# Patient Record
Sex: Female | Born: 1990 | Race: Black or African American | Hispanic: No | Marital: Single | Smoking: Never smoker
Health system: Southern US, Community
[De-identification: ages and names within clinical notes are randomized; demographics above are authoritative.]

## PROBLEM LIST (undated history)

## (undated) DIAGNOSIS — Z789 Other specified health status: Secondary | ICD-10-CM

## (undated) HISTORY — DX: Other specified health status: Z78.9

---
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
## 2015-04-01 ENCOUNTER — Encounter (HOSPITAL_COMMUNITY): Payer: Self-pay | Admitting: *Deleted

## 2015-04-01 ENCOUNTER — Inpatient Hospital Stay (HOSPITAL_COMMUNITY)
Admission: AD | Admit: 2015-04-01 | Discharge: 2015-04-01 | Disposition: A | Discharge status: Home / Self Care | Payer: Medicaid Other | Source: Ambulatory Visit | Attending: Obstetrics & Gynecology | Admitting: Obstetrics & Gynecology

## 2015-04-01 DIAGNOSIS — A499 Bacterial infection, unspecified: Secondary | ICD-10-CM | POA: Diagnosis not present

## 2015-04-01 DIAGNOSIS — F17210 Nicotine dependence, cigarettes, uncomplicated: Secondary | ICD-10-CM | POA: Diagnosis not present

## 2015-04-01 DIAGNOSIS — B9689 Other specified bacterial agents as the cause of diseases classified elsewhere: Secondary | ICD-10-CM

## 2015-04-01 DIAGNOSIS — N760 Acute vaginitis: Secondary | ICD-10-CM

## 2015-04-01 DIAGNOSIS — N898 Other specified noninflammatory disorders of vagina: Secondary | ICD-10-CM | POA: Diagnosis present

## 2015-04-01 LAB — WET PREP, GENITAL
TRICH WET PREP: NONE SEEN
YEAST WET PREP: NONE SEEN

## 2015-04-01 LAB — URINE MICROSCOPIC-ADD ON

## 2015-04-01 LAB — URINALYSIS, ROUTINE W REFLEX MICROSCOPIC
BILIRUBIN URINE: NEGATIVE
GLUCOSE, UA: NEGATIVE mg/dL
KETONES UR: NEGATIVE mg/dL
Leukocytes, UA: NEGATIVE
NITRITE: NEGATIVE
PH: 6 (ref 5.0–8.0)
PROTEIN: NEGATIVE mg/dL
Specific Gravity, Urine: 1.025 (ref 1.005–1.030)
Urobilinogen, UA: 0.2 mg/dL (ref 0.0–1.0)

## 2015-04-01 LAB — POCT PREGNANCY, URINE: PREG TEST UR: NEGATIVE

## 2015-04-01 MED ORDER — METRONIDAZOLE 500 MG PO TABS: 500.0000 mg | ORAL_TABLET | Freq: Two times a day (BID) | ORAL | Status: DC

## 2015-04-01 NOTE — MAU Provider Note (Signed)
History     CSN: [id number]  Arrival date and time: 04/01/15 [id number]   First Provider Initiated Contact with Patient 04/01/15 1937      Chief Complaint  Patient presents with  . Vaginal Discharge   HPI Ms. Helen Powell is a 24 y.o. G2P2000 who presents to MAU today with complaint of vaginal discharge x 2 months. The patient states off-white color and mild odor noted. She denies vaginal bleeding, abdominal pain, fever, N/V/D or UTI symptoms. She has not tried any over-the-counter medications. She is not currently sexually active and does not use any birth control.   OB History    Gravida Para Term Preterm AB TAB SAB Ectopic Multiple Living   2 2 2              History reviewed. No pertinent past medical history.  Past Surgical History  Procedure Laterality Date  . Cesarean section      Family History  Problem Relation Age of Onset  . Hypertension Mother   . Diabetes Mother     History  Substance Use Topics  . Smoking status: Current Some Day Smoker  . Smokeless tobacco: Not on file  . Alcohol Use: No    Allergies: No Known Allergies  No prescriptions prior to admission    Review of Systems  Constitutional: Negative for fever and malaise/fatigue.  Gastrointestinal: Negative for nausea, vomiting, abdominal pain, diarrhea and constipation.  Genitourinary: Negative for dysuria, urgency and frequency.       + vaginal discharge Neg - vaginal bleeding   Physical Exam   Blood pressure 128/70, pulse 63, temperature 98.3 F (36.8 C), temperature source Oral, resp. rate 16, height 5' 4.5" (1.638 m), weight 103 lb 10.4 oz (47.015 kg), last menstrual period 03/22/2015, SpO2 100 %.  Physical Exam  Nursing note and vitals reviewed. Constitutional: She is oriented to person, place, and time. She appears well-developed and well-nourished. No distress.  HENT:  Head: Normocephalic and atraumatic.  Cardiovascular: Normal rate.   Respiratory: Effort normal.  GI: Soft. She  exhibits no distension and no mass. There is no tenderness. There is no rebound and no guarding.  Genitourinary: Uterus is not enlarged and not tender. Cervix exhibits no motion tenderness, no discharge and no friability. Right adnexum displays no mass and no tenderness. Left adnexum displays no mass and no tenderness. No bleeding in the vagina. Vaginal discharge (scant mucus discharge noted with mild odor) found.  Neurological: She is alert and oriented to person, place, and time.  Skin: Skin is warm and dry. No erythema.  Psychiatric: She has a normal mood and affect.   Results for orders placed or performed during the hospital encounter of 04/01/15 (from the past 24 hour(s))  Urinalysis, Routine w reflex microscopic     Status: Abnormal   Collection Time: 04/01/15  7:16 PM  Result Value Ref Range   Color, Urine YELLOW YELLOW   APPearance CLEAR CLEAR   Specific Gravity, Urine 1.025 1.005 - 1.030   pH 6.0 5.0 - 8.0   Glucose, UA NEGATIVE NEGATIVE mg/dL   Hgb urine dipstick TRACE (A) NEGATIVE   Bilirubin Urine NEGATIVE NEGATIVE   Ketones, ur NEGATIVE NEGATIVE mg/dL   Protein, ur NEGATIVE NEGATIVE mg/dL   Urobilinogen, UA 0.2 0.0 - 1.0 mg/dL   Nitrite NEGATIVE NEGATIVE   Leukocytes, UA NEGATIVE NEGATIVE  Urine microscopic-add on     Status: None   Collection Time: 04/01/15  7:16 PM  Result Value Ref Range  Squamous Epithelial / LPF RARE RARE   WBC, UA 0-2 <3 WBC/hpf   RBC / HPF 0-2 <3 RBC/hpf   Bacteria, UA RARE RARE  Pregnancy, urine POC     Status: None   Collection Time: 04/01/15  7:33 PM  Result Value Ref Range   Preg Test, Ur NEGATIVE NEGATIVE  Wet prep, genital     Status: Abnormal   Collection Time: 04/01/15  7:43 PM  Result Value Ref Range   Yeast Wet Prep HPF POC NONE SEEN NONE SEEN   Trich, Wet Prep NONE SEEN NONE SEEN   Clue Cells Wet Prep HPF POC FEW (A) NONE SEEN   WBC, Wet Prep HPF POC FEW (A) NONE SEEN    MAU Course  Procedures None  MDM UPT -  negative UA, wet prep, GC/Chlamydia, RPR and HIV today  Assessment and Plan  A: Bacterial vaginosis  P: Discharge home Rx for Flagyl given to patient Patient advised to follow-up with PCP as needed  Patient may return to MAU as needed or if her condition were to change or worsen   Marny LowensteinJulie N Brooklyn Alfredo, PA-C  04/01/2015, 8:44 PM

## 2015-04-01 NOTE — MAU Note (Signed)
Cloudy malodorous vaginal discharge daily for several months. Denies vaginal irritation. Denies urinary complaints. LMP 4/28.  Denies abdominal pain.

## 2015-04-01 NOTE — Discharge Instructions (Signed)
Bacterial Vaginosis Bacterial vaginosis is a vaginal infection that occurs when the normal balance of bacteria in the vagina is disrupted. It results from an overgrowth of certain bacteria. This is the most common vaginal infection in women of childbearing age. Treatment is important to prevent complications, especially in pregnant women, as it can cause a premature delivery. CAUSES  Bacterial vaginosis is caused by an increase in harmful bacteria that are normally present in smaller amounts in the vagina. Several different kinds of bacteria can cause bacterial vaginosis. However, the reason that the condition develops is not fully understood. RISK FACTORS Certain activities or behaviors can put you at an increased risk of developing bacterial vaginosis, including:  Having a new sex partner or multiple sex partners.  Douching.  Using an intrauterine device (IUD) for contraception. Women do not get bacterial vaginosis from toilet seats, bedding, swimming pools, or contact with objects around them. SIGNS AND SYMPTOMS  Some women with bacterial vaginosis have no signs or symptoms. Common symptoms include:  Grey vaginal discharge.  A fishlike odor with discharge, especially after sexual intercourse.  Itching or burning of the vagina and vulva.  Burning or pain with urination. DIAGNOSIS  Your health care provider will take a medical history and examine the vagina for signs of bacterial vaginosis. A sample of vaginal fluid may be taken. Your health care provider will look at this sample under a microscope to check for bacteria and abnormal cells. A vaginal pH test may also be done.  TREATMENT  Bacterial vaginosis may be treated with antibiotic medicines. These may be given in the form of a pill or a vaginal cream. A second round of antibiotics may be prescribed if the condition comes back after treatment.  HOME CARE INSTRUCTIONS   Only take over-the-counter or prescription medicines as  directed by your health care provider.  If antibiotic medicine was prescribed, take it as directed. Make sure you finish it even if you start to feel better.  Do not have sex until treatment is completed.  Tell all sexual partners that you have a vaginal infection. They should see their health care provider and be treated if they have problems, such as a mild rash or itching.  Practice safe sex by using condoms and only having one sex partner. SEEK MEDICAL CARE IF:   Your symptoms are not improving after 3 days of treatment.  You have increased discharge or pain.  You have a fever. MAKE SURE YOU:   Understand these instructions.  Will watch your condition.  Will get help right away if you are not doing well or get worse. FOR MORE INFORMATION  Centers for Disease Control and Prevention, Division of STD Prevention: www.cdc.gov/std American Sexual Health Association (ASHA): www.ashastd.org  Document Released: 11/10/2005 Document Revised: 08/31/2013 Document Reviewed: 06/22/2013 ExitCare Patient Information 2015 [employer]. This information is not intended to replace advice given to you by your health care provider. Make sure you discuss any questions you have with your health care provider.  

## 2015-04-02 LAB — GC/CHLAMYDIA PROBE AMP (~~LOC~~) NOT AT ARMC
Chlamydia: NEGATIVE
NEISSERIA GONORRHEA: NEGATIVE

## 2015-04-02 LAB — HIV ANTIBODY (ROUTINE TESTING W REFLEX): HIV SCREEN 4TH GENERATION: NONREACTIVE

## 2015-04-02 LAB — RPR: RPR Ser Ql: NONREACTIVE

## 2017-12-01 DIAGNOSIS — Z98891 History of uterine scar from previous surgery: Secondary | ICD-10-CM

## 2020-10-25 ENCOUNTER — Emergency Department (HOSPITAL_COMMUNITY)
Admission: EM | Admit: 2020-10-25 | Discharge: 2020-10-26 | Disposition: A | Discharge status: Home / Self Care | Payer: Self-pay | Attending: Emergency Medicine | Admitting: Emergency Medicine

## 2020-10-25 ENCOUNTER — Encounter (HOSPITAL_COMMUNITY): Payer: Self-pay | Admitting: Emergency Medicine

## 2020-10-25 ENCOUNTER — Other Ambulatory Visit: Payer: Self-pay

## 2020-10-25 DIAGNOSIS — E349 Endocrine disorder, unspecified: Secondary | ICD-10-CM

## 2020-10-25 DIAGNOSIS — R891 Abnormal level of hormones in specimens from other organs, systems and tissues: Secondary | ICD-10-CM | POA: Insufficient documentation

## 2020-10-25 DIAGNOSIS — F17200 Nicotine dependence, unspecified, uncomplicated: Secondary | ICD-10-CM | POA: Insufficient documentation

## 2020-10-25 DIAGNOSIS — B9689 Other specified bacterial agents as the cause of diseases classified elsewhere: Secondary | ICD-10-CM | POA: Insufficient documentation

## 2020-10-25 DIAGNOSIS — N760 Acute vaginitis: Secondary | ICD-10-CM | POA: Insufficient documentation

## 2020-10-25 DIAGNOSIS — N3000 Acute cystitis without hematuria: Secondary | ICD-10-CM | POA: Insufficient documentation

## 2020-10-25 LAB — URINALYSIS, ROUTINE W REFLEX MICROSCOPIC
Bilirubin Urine: NEGATIVE
Glucose, UA: 50 mg/dL — AB
Ketones, ur: NEGATIVE mg/dL
Nitrite: NEGATIVE
Protein, ur: 100 mg/dL — AB
Specific Gravity, Urine: 1.023 (ref 1.005–1.030)
WBC, UA: >50 WBC/hpf — ABNORMAL HIGH (ref 0–5)
pH: 7 (ref 5.0–8.0)

## 2020-10-25 LAB — CBC WITH DIFFERENTIAL/PLATELET
Abs Immature Granulocytes: 0.01 10*3/uL (ref 0.00–0.07)
Basophils Absolute: 0 10*3/uL (ref 0.0–0.1)
Basophils Relative: 1 %
Eosinophils Absolute: 0.1 10*3/uL (ref 0.0–0.5)
Eosinophils Relative: 2 %
HCT: 32.1 % — ABNORMAL LOW (ref 36.0–46.0)
Hemoglobin: 9.7 g/dL — ABNORMAL LOW (ref 12.0–15.0)
Immature Granulocytes: 0 %
Lymphocytes Relative: 42 %
Lymphs Abs: 2.4 10*3/uL (ref 0.7–4.0)
MCH: 24.6 pg — ABNORMAL LOW (ref 26.0–34.0)
MCHC: 30.2 g/dL (ref 30.0–36.0)
MCV: 81.5 fL (ref 80.0–100.0)
Monocytes Absolute: 0.7 10*3/uL (ref 0.1–1.0)
Monocytes Relative: 12 %
Neutro Abs: 2.5 10*3/uL (ref 1.7–7.7)
Neutrophils Relative %: 43 %
Platelets: 260 10*3/uL (ref 150–400)
RBC: 3.94 MIL/uL (ref 3.87–5.11)
RDW: 16.3 % — ABNORMAL HIGH (ref 11.5–15.5)
WBC: 5.7 10*3/uL (ref 4.0–10.5)
nRBC: 0 % (ref 0.0–0.2)

## 2020-10-25 LAB — BASIC METABOLIC PANEL
Anion gap: 10 (ref 5–15)
BUN: 11 mg/dL (ref 6–20)
CO2: 25 mmol/L (ref 22–32)
Calcium: 9.6 mg/dL (ref 8.9–10.3)
Chloride: 103 mmol/L (ref 98–111)
Creatinine, Ser: 0.73 mg/dL (ref 0.44–1.00)
GFR, Estimated: >60 mL/min (ref >60)
Glucose, Bld: 93 mg/dL (ref 70–99)
Potassium: 3.4 mmol/L — ABNORMAL LOW (ref 3.5–5.1)
Sodium: 138 mmol/L (ref 135–145)

## 2020-10-25 LAB — WET PREP, GENITAL
Sperm: NONE SEEN
Trich, Wet Prep: NONE SEEN
Yeast Wet Prep HPF POC: NONE SEEN

## 2020-10-25 LAB — I-STAT BETA HCG BLOOD, ED (MC, WL, AP ONLY): I-stat hCG, quantitative: 28.1 m[IU]/mL — ABNORMAL HIGH (ref <5)

## 2020-10-25 NOTE — ED Provider Notes (Signed)
[facility] EMERGENCY DEPARTMENT Provider Note   CSN: [id number] Arrival date & time: 10/25/20  1908     History Chief Complaint  Patient presents with   Dysuria    Helen Powell is a 29 y.o. female.  The history is provided by the patient and medical records.  Dysuria  Helen Powell is a 29 y.o. female who presents to the Emergency Department complaining of dysuria. She is a G5 P30 23 that is here one month status post 13 week D&C for two days of dysuria. She has experienced persistent spotting since the [facility] and has some mild lower abdominal discomfort.  She describes the spotting as looking like old blood. She comes in due to concern for STD. She is sexually active and does not use protection. She denies any vaginal discharge. She denies any fevers, nausea, vomiting. Symptoms are mild to moderate in nature.    History reviewed. No pertinent past medical history.  There are no problems to display for this patient.   Past Surgical History:  Procedure Laterality Date   CESAREAN SECTION       OB History    Gravida  2   Para  2   Term  2   Preterm      AB      Living        SAB      TAB      Ectopic      Multiple      Live Births              Family History  Problem Relation Age of Onset   Hypertension Mother    Diabetes Mother     Social History   Tobacco Use   Smoking status: Current Some Day Smoker   Smokeless tobacco: Never Used  Substance Use Topics   Alcohol use: No   Drug use: No    Home Medications Prior to Admission medications   Medication Sig Start Date End Date Taking? Authorizing Provider  cephALEXin (KEFLEX) 500 MG capsule Take 1 capsule (500 mg total) by mouth 3 (three) times daily. 10/26/20   Tilden Fossa, MD  metroNIDAZOLE (FLAGYL) 500 MG tablet Take 1 tablet (500 mg total) by mouth 2 (two) times daily. 10/26/20   Tilden Fossa, MD    Allergies    Patient has no known  allergies.  Review of Systems   Review of Systems  Genitourinary: Positive for dysuria.  All other systems reviewed and are negative.   Physical Exam Updated Vital Signs BP (!) 114/57 (BP Location: Left Arm)    Pulse 79    Temp 98.6 F (37 C) (Oral)    Resp 16    Ht 5\' 5"  (1.651 m)    Wt 48 kg    SpO2 99%    BMI 17.61 kg/m   Physical Exam Vitals and nursing note reviewed.  Constitutional:      Appearance: She is well-developed.  HENT:     Head: Normocephalic and atraumatic.  Cardiovascular:     Rate and Rhythm: Normal rate and regular rhythm.  Pulmonary:     Effort: Pulmonary effort is normal. No respiratory distress.  Abdominal:     Palpations: Abdomen is soft.     Tenderness: There is no abdominal tenderness. There is no guarding or rebound.  Genitourinary:    Comments: No CMT. Scant brown vaginal discharge Musculoskeletal:        General: No tenderness.  Skin:  General: Skin is warm and dry.  Neurological:     Mental Status: She is alert and oriented to person, place, and time.  Psychiatric:        Behavior: Behavior normal.     ED Results / Procedures / Treatments   Labs (all labs ordered are listed, but only abnormal results are displayed) Labs Reviewed  WET PREP, GENITAL - Abnormal; Notable for the following components:      Result Value   Clue Cells Wet Prep HPF POC PRESENT (*)    WBC, Wet Prep HPF POC FEW (*)    All other components within normal limits  URINALYSIS, ROUTINE W REFLEX MICROSCOPIC - Abnormal; Notable for the following components:   APPearance HAZY (*)    Glucose, UA 50 (*)    Hgb urine dipstick MODERATE (*)    Protein, ur 100 (*)    Leukocytes,Ua MODERATE (*)    WBC, UA >50 (*)    Bacteria, UA MANY (*)    All other components within normal limits  CBC WITH DIFFERENTIAL/PLATELET - Abnormal; Notable for the following components:   Hemoglobin 9.7 (*)    HCT 32.1 (*)    MCH 24.6 (*)    RDW 16.3 (*)    All other components within  normal limits  BASIC METABOLIC PANEL - Abnormal; Notable for the following components:   Potassium 3.4 (*)    All other components within normal limits  HCG, QUANTITATIVE, PREGNANCY - Abnormal; Notable for the following components:   hCG, Beta Chain, Quant, S 33 (*)    All other components within normal limits  I-STAT BETA HCG BLOOD, ED ([facility], [facility], AP ONLY) - Abnormal; Notable for the following components:   I-stat hCG, quantitative 28.1 (*)    All other components within normal limits  RPR  HIV ANTIBODY (ROUTINE TESTING W REFLEX)  GC/CHLAMYDIA PROBE AMP ([place]) NOT AT [facility]    EKG None  Radiology No results found.  Procedures Procedures (including critical care time)  Medications Ordered in ED Medications  cephALEXin (KEFLEX) capsule 500 mg (has no administration in time range)  metroNIDAZOLE (FLAGYL) tablet 500 mg (has no administration in time range)    ED Course  I have reviewed the triage vital signs and the nursing notes.  Pertinent labs & imaging results that were available during my care of the patient were reviewed by me and considered in my medical decision making (see chart for details).    MDM Rules/Calculators/A&P                         patient here for evaluation of two days of dysuria and vaginal spotting since D&C one month ago. HCG is mildly elevated at 33. UA is consistent with UTI. Pelvic examination is benign. Presentation is not consistent with PID endometriosis. She did not have her hCG following 20 following her D&C. Discussed with Dr. Shawnie Pons with OB/GYN, will plan for repeat quant in one week. Discussed with patient importance of return precautions as well as outpatient follow-up for retesting.  Final Clinical Impression(s) / ED Diagnoses Final diagnoses:  Acute cystitis without hematuria  Elevated serum hCG  BV (bacterial vaginosis)    Rx / DC Orders ED Discharge Orders         Ordered    cephALEXin (KEFLEX) 500 MG capsule  3 times daily         10/26/20 0016    metroNIDAZOLE (FLAGYL) 500 MG tablet  2 times daily        10/26/20 0016           Tilden Fossa, MD 10/26/20 [phone]

## 2020-10-25 NOTE — ED Triage Notes (Signed)
Patient reports difficulty in urinating onset this week , no hematuria or fever .

## 2020-10-26 LAB — GC/CHLAMYDIA PROBE AMP (~~LOC~~) NOT AT ARMC
Chlamydia: NEGATIVE
Comment: NEGATIVE
Comment: NORMAL
Neisseria Gonorrhea: NEGATIVE

## 2020-10-26 LAB — RPR: RPR Ser Ql: NONREACTIVE

## 2020-10-26 LAB — HIV ANTIBODY (ROUTINE TESTING W REFLEX): HIV Screen 4th Generation wRfx: NONREACTIVE

## 2020-10-26 LAB — HCG, QUANTITATIVE, PREGNANCY: hCG, Beta Chain, Quant, S: 33 m[IU]/mL — ABNORMAL HIGH (ref <5)

## 2020-10-26 MED ORDER — METRONIDAZOLE 500 MG PO TABS: 500.0000 mg | ORAL_TABLET | Freq: Two times a day (BID) | ORAL | 0 refills | Status: DC

## 2020-10-26 MED ORDER — CEPHALEXIN 250 MG PO CAPS
500.0000 mg | ORAL_CAPSULE | Freq: Once | ORAL | Status: AC
Start: 2020-10-26 — End: 2020-10-26
  Administered 2020-10-26: 500 mg via ORAL
  Filled 2020-10-26: qty 2

## 2020-10-26 MED ORDER — CEPHALEXIN 500 MG PO CAPS: 500.0000 mg | ORAL_CAPSULE | Freq: Three times a day (TID) | ORAL | 0 refills | Status: DC

## 2020-10-26 MED ORDER — METRONIDAZOLE 500 MG PO TABS
500.0000 mg | ORAL_TABLET | Freq: Once | ORAL | Status: AC
  Administered 2020-10-26: 500 mg via ORAL
  Filled 2020-10-26: qty 1

## 2020-10-26 NOTE — Discharge Instructions (Signed)
Your pregnancy test was positive today. It is unclear if you're newly pregnant or this is related to your recent procedure. If you develop abdominal pain, heavy bleeding or new concerning symptoms you need to be rechecked immediately. Please follow-up with the women's health care center in one week so you can have your pregnancy hormone rechecked.

## 2020-11-01 ENCOUNTER — Other Ambulatory Visit: Payer: Self-pay | Admitting: *Deleted

## 2020-11-01 DIAGNOSIS — Z3490 Encounter for supervision of normal pregnancy, unspecified, unspecified trimester: Secondary | ICD-10-CM

## 2020-11-02 ENCOUNTER — Other Ambulatory Visit: Payer: Self-pay

## 2020-11-02 DIAGNOSIS — Z3490 Encounter for supervision of normal pregnancy, unspecified, unspecified trimester: Secondary | ICD-10-CM

## 2020-11-03 LAB — BETA HCG QUANT (REF LAB): hCG Quant: 12 m[IU]/mL

## 2021-11-19 ENCOUNTER — Other Ambulatory Visit: Payer: Self-pay

## 2021-11-19 ENCOUNTER — Ambulatory Visit (INDEPENDENT_AMBULATORY_CARE_PROVIDER_SITE_OTHER): Payer: Medicaid Other | Admitting: *Deleted

## 2021-11-19 VITALS — BP 109/62 | HR 90 | Temp 98.1°F | Ht 65.0 in | Wt 110.4 lb

## 2021-11-19 DIAGNOSIS — O0930 Supervision of pregnancy with insufficient antenatal care, unspecified trimester: Secondary | ICD-10-CM

## 2021-11-19 DIAGNOSIS — O99320 Drug use complicating pregnancy, unspecified trimester: Secondary | ICD-10-CM | POA: Insufficient documentation

## 2021-11-19 DIAGNOSIS — Z3480 Encounter for supervision of other normal pregnancy, unspecified trimester: Secondary | ICD-10-CM | POA: Insufficient documentation

## 2021-11-19 DIAGNOSIS — F1290 Cannabis use, unspecified, uncomplicated: Secondary | ICD-10-CM

## 2021-11-19 MED ORDER — PRENATAL PLUS VITAMIN/MINERAL 27-1 MG PO TABS: 1.0000 | ORAL_TABLET | Freq: Every day | ORAL | 12 refills | Status: AC

## 2021-11-19 MED ORDER — TERCONAZOLE 0.4 % VA CREA: 1.0000 | TOPICAL_CREAM | Freq: Every day | VAGINAL | 0 refills | Status: DC

## 2021-11-19 NOTE — Progress Notes (Signed)
° ° °  I discussed the limitations, risks, security and privacy concerns of performing an evaluation and management service by telephone and the availability of in person appointments. I also discussed with the patient that there may be a patient responsible charge related to this service. The patient expressed understanding and agreed to proceed.   Location: [facility] Patient: clinic with FOB Tyree Provider: clinic Interpreter used: no professional interpreter  PRENATAL INTAKE SUMMARY  Helen Powell presents today New OB Nurse Interview.  OB History     Gravida  6   Para  3   Term  3   Preterm      AB  2   Living  3      SAB  1   IAB      Ectopic      Multiple      Live Births  3          I have reviewed the patient's medical, obstetrical, social, and family histories, medications, and available lab results.  SUBJECTIVE She has complaints vaginal itching. Recently diagnosed with BV, completed Metronidazole 500 mg 1 PO BID.  Marijuana usage during pregnancy. Last used 11/18/2021.  OBJECTIVE Initial Nurse interview for history/labs (New OB). LATE TO CARE  last menstrual period date: 05/07/2021 (patient is uncertain of LMP) EDD: 02/16/2022 by LMP  GA: [redacted]w[redacted]d [id number] FHT: 140  GENERAL APPEARANCE: alert, well appearing, in no apparent distress, oriented to person, place and time   ASSESSMENT Normal pregnancy  PLAN Prenatal care:  [facility] Pnl/HIV/Hep C OB Urine Culture GC/CT PAP:  unsure of last PAP HgbEval/SMA/CF ([place]) [facility] A1C Rx PNV sent to pharmacy Rx Terazole vaginal cream for vaginal itching Ultrasound MFM complete >14 weeks to confirm dating and anatomy scan ROI for previous prenatal records due to cesarean deliveries x 3  Blood Pressure Monitor/Weight Scale BP monitor/Weight Scale in person visits  MyChart/Babyscripts MyChart access verified. I explained pt will have some visits in office and some virtually.  Babyscripts instructions given and order placed.   Placed OB Box on problem list and updated   Followup with Raelyn Mora, CNM  APP 11/20/2021 at  10:35 AM  11/26/2021 8:30 AM 2 hr gtt Lab visit  Follow Up Instructions:   I discussed the assessment and treatment plan with the patient. The patient was provided an opportunity to ask questions and all were answered. The patient agreed with the plan and demonstrated an understanding of the instructions.   The patient was advised to call back or seek an in-person evaluation if the symptoms worsen or if the condition fails to improve as anticipated.  I provided 40 minutes of  face-to-face time during this encounter.  Clovis Pu, RN

## 2021-11-19 NOTE — Patient Instructions (Signed)
° °  Genetic Screening Results Information: You are having genetic testing called [facility] today.  It will take approximately 2 weeks before the results are available.  To get your results, you need Internet access to a web browser to search [place]/MyChart (the direct app on your phone will not give you these results).  Then select Lab Scanned and click on the blue hyper link that says View Image to see your [facility] results.  You can also use the directions on the purple card given to look up your results directly on the [facility] website.   Please remember that if you are not able to keep your appointment to call the office and reschedule, or cancel. If you no-show or cancel with less than 24 hour notice we will charge you, not your insurance a $50 fee.

## 2021-11-20 ENCOUNTER — Encounter: Payer: Self-pay | Admitting: Obstetrics and Gynecology

## 2021-11-20 ENCOUNTER — Ambulatory Visit (INDEPENDENT_AMBULATORY_CARE_PROVIDER_SITE_OTHER): Payer: Medicaid Other | Admitting: Obstetrics and Gynecology

## 2021-11-20 ENCOUNTER — Other Ambulatory Visit (HOSPITAL_COMMUNITY)
Admission: RE | Admit: 2021-11-20 | Discharge: 2021-11-20 | Disposition: A | Discharge status: Home / Self Care | Payer: Medicaid Other | Source: Ambulatory Visit | Attending: Obstetrics and Gynecology | Admitting: Obstetrics and Gynecology

## 2021-11-20 ENCOUNTER — Ambulatory Visit (INDEPENDENT_AMBULATORY_CARE_PROVIDER_SITE_OTHER): Payer: Medicaid Other | Admitting: Licensed Clinical Social Worker

## 2021-11-20 VITALS — BP 106/71 | HR 95 | Temp 98.0°F | Wt 109.6 lb

## 2021-11-20 DIAGNOSIS — O4693 Antepartum hemorrhage, unspecified, third trimester: Secondary | ICD-10-CM

## 2021-11-20 DIAGNOSIS — Z3480 Encounter for supervision of other normal pregnancy, unspecified trimester: Secondary | ICD-10-CM

## 2021-11-20 DIAGNOSIS — Z124 Encounter for screening for malignant neoplasm of cervix: Secondary | ICD-10-CM | POA: Insufficient documentation

## 2021-11-20 DIAGNOSIS — O99340 Other mental disorders complicating pregnancy, unspecified trimester: Secondary | ICD-10-CM | POA: Diagnosis not present

## 2021-11-20 DIAGNOSIS — F32A Depression, unspecified: Secondary | ICD-10-CM | POA: Diagnosis not present

## 2021-11-20 DIAGNOSIS — Z3A27 27 weeks gestation of pregnancy: Secondary | ICD-10-CM | POA: Diagnosis not present

## 2021-11-20 DIAGNOSIS — Z3490 Encounter for supervision of normal pregnancy, unspecified, unspecified trimester: Secondary | ICD-10-CM | POA: Insufficient documentation

## 2021-11-20 DIAGNOSIS — N898 Other specified noninflammatory disorders of vagina: Secondary | ICD-10-CM

## 2021-11-20 DIAGNOSIS — O26899 Other specified pregnancy related conditions, unspecified trimester: Secondary | ICD-10-CM

## 2021-11-20 DIAGNOSIS — O0933 Supervision of pregnancy with insufficient antenatal care, third trimester: Secondary | ICD-10-CM

## 2021-11-20 NOTE — Progress Notes (Signed)
INITIAL OBSTETRICAL VISIT Patient name: Helen Powell MRN [id number]  Date of birth: 1990-12-26 Chief Complaint:   Initial Prenatal Visit  History of Present Illness:   Helen Powell is a 30 y.o. [id number] African American female at [redacted]w[redacted]d by LMP with an Estimated Date of Delivery: 02/16/22 being seen today for her initial obstetrical visit.  Her obstetrical history is significant for smoker and daily marijuana use . This is an unplanned pregnancy. She and the father of the baby (FOB) "Tyree" live together. She has a support system that consists of her family/friends. Today she reports  she just moved here form [place], [place] . She states that she "tried to apply for Medicaid twice but could not get it." She did not receive any PNC. She had some hospital visits where she reports "they only allowed me to swab myself and did not examine or scan me."   Patient's last menstrual period was 05/12/2021 (within months). Last pap unsure. Review of Systems:   Pertinent items are noted in HPI Denies cramping/contractions, leakage of fluid, vaginal bleeding, abnormal vaginal discharge w/ itching/odor/irritation, headaches, visual changes, shortness of breath, chest pain, abdominal pain, severe nausea/vomiting, or problems with urination or bowel movements unless otherwise stated above.  Pertinent History Reviewed:  Reviewed past medical,surgical, social, obstetrical and family history.  Reviewed problem list, medications and allergies. OB History  Gravida Para Term Preterm AB Living  $Remov'6 3 3   2 3  'fKjvlW$ SAB IAB Ectopic Multiple Live Births  1       3    # Outcome Date GA Lbr Len/2nd Weight Sex Delivery Anes PTL Lv  6 Current           5 AB 2021          4 Term 12/01/17   6 lb (2.722 kg) M CS-LTranv  N LIV  3 SAB 2019          2 Term 08/02/12   7 lb (3.175 kg) M CS-LTranv  N LIV  1 Term 11/20/10   8 lb (3.629 kg) F CS-LTranv  N LIV     Complications: Failure of cervical dilation   Physical  Assessment:   Vitals:   11/20/21 1100  BP: 106/71  Pulse: 95  Temp: 98 F (36.7 C)  Weight: 109 lb 9.6 oz (49.7 kg)  Body mass index is 18.24 kg/m.       Physical Examination:  General appearance - well appearing, and in no distress  Mental status - alert, oriented to person, place, and time  Psych:  She has a normal mood and affect  Skin - warm and dry, normal color, no suspicious lesions noted  Chest - effort normal, all lung fields clear to auscultation bilaterally  Heart - normal rate and regular rhythm  Abdomen - soft, nontender  Extremities:  No swelling or varicosities noted  Pelvic - VULVA: normal appearing vulva with no masses, tenderness or lesions  VAGINA: normal appearing vagina with normal color and discharge, no lesions.   CERVIX: normal appearing cervix without discharge or lesions, no CMT - moderate BRB noted after pap - 8 large cotton tipped swabs used to slow bleeding. NO digital cervical exam done since unknown placental location  Thin prep pap is done with reflex HR HPV cotesting   FHTs by doppler: 136 bpm  Assessment & Plan:  1) Low-Risk Pregnancy [id number] at [redacted]w[redacted]d with an Estimated Date of Delivery: 02/16/22   2) Initial OB visit - Welcomed to practice  and introduced self to patient in addition to discussing other advanced practice providers that she may be seeing at this practice - Congratulated patient - Anticipatory guidance on upcoming appointments - Educated on [place] and pregnancy and the integration of virtual appointments  - Educated on babyscripts app- patient reports she has not received email, encouraged to look in spam folder and to call office if she still has not received email - patient verbalizes understanding - PHQ-9 score elevated >> Met with Lynnea Ferrier, LCSW; see separate documentation    3) Pap smear for cervical cancer screening - Cytology - PAP( [place])  4) Supervision of other normal pregnancy, antepartum - Cytology - PAP(  [place]) - Cervicovaginal ancillary only( [place])  5) [redacted] weeks gestation of pregnancy - Cytology - PAP( [place]) - Cervicovaginal ancillary only( [place])  6) Vaginal discharge during pregnancy, antepartum - Cervicovaginal ancillary only( [place])  7) Vaginal itching - Cervicovaginal ancillary only( [place])  8) Vaginal bleeding in pregnancy, third trimester - Attempting to move scheduled U/S from 11/22/21 to today or tomorrow >>pending U/S scheduler - Advised to go to MAU: If you have heavier bleeding that soaks through more that 1 pad per hour for an hour or more If you bleed so much that you feel like you might pass out or you do pass out If you have significant abdominal pain that is not improved with Tylenol 1000 mg every 8 hours as needed for pain If you develop a fever > 100.5     Meds: No orders of the defined types were placed in this encounter.   Initial labs obtained Continue prenatal vitamins Reviewed n/v relief measures and warning s/s to report Reviewed recommended weight gain based on pre-gravid BMI Encouraged well-balanced diet Genetic Screening discussed: ordered Cystic fibrosis, SMA, Fragile X screening discussed ordered The nature of [facility] with multiple MDs and other Advanced Practice Providers was explained to patient; also emphasized that residents, students are part of our team.  Discussed optimized OB schedule and video visits. Advised can have an in-office visit whenever she feels she needs to be seen.  Does not have own BP cuff. BP cuff Rx faxed yesterday. Explained to patient that BP will be mailed to her house. Check BP weekly, let us know if >140/90. Advised to call during normal business hours and there is an after-hours nurse line available.    Follow-up: Return in about 5 weeks (around 12/25/2021) for Return OB visit.   Orders Placed This Encounter  Procedures   [place] MFM OB DETAIL  +14 [place] MSN, [place] 11/20/2021

## 2021-11-20 NOTE — Patient Instructions (Signed)
Return to MAU: If you have heavier bleeding that soaks through more that 1 pad per hour for an hour or more If you bleed so much that you feel like you might pass out or you do pass out If you have significant abdominal pain that is not improved with Tylenol 1000 mg every 8 hours as needed for pain If you develop a fever > 100.5

## 2021-11-20 NOTE — BH Specialist Note (Signed)
Integrated Behavioral Health Initial In-Person Visit  MRN: [id number] Name: Helen Powell  Number of Integrated Behavioral Health Clinician visits:: 1/6 Session Start time: 11:00am  Session End time: 11:55am Total time: 55  minutes in person at [facility]   Types of Service: Individual psychotherapy  Interpretor:No. Interpretor Name and Language: None    Warm Hand Off Completed.        Subjective: Helen Powell is a 30 y.o. female accompanied by n/a Patient was referred by Helen Powell CNM for depression and high PHQ9. Patient reports the following symptoms/concerns: depressive symptoms, family conflict Duration of problem: 2011; Severity of problem: mild  Objective: Mood: Depressed and Affect: Appropriate Risk of harm to self or others: No plan to harm self or others  Life Context: Family and Social: Lives with family in [place] School/Work: n/a Self-Care: n/a Life Changes: relocated to [place] from [place], [place]  Patient and/or Family's Strengths/Protective Factors: Social connections  Goals Addressed: Patient will: Reduce symptoms of: depression Increase knowledge and/or ability of: coping skills  Demonstrate ability to: Increase healthy adjustment to current life circumstances and Increase adequate support systems for patient/family  Progress towards Goals: Ongoing  Interventions: Interventions utilized: Solution-Focused Strategies and Supportive Counseling  Standardized Assessments completed: PHQ 9 Flowsheet Row Initial Prenatal from 11/20/2021 in [facility]  PHQ-9 Total Score 24        Patient and/or Family Response: Helen Powell responded well to session  Assessment: Patient currently experiencing depression affecting pregnancy.   Patient may benefit from integrated behavioral health.  Plan: Follow up with behavioral health clinician on : 12/25/2021 Behavioral recommendations: Create boundaries, avoid conflict, contact  community resources given, prioritize rest, engage in self care and stress reducing activity Referral(s): Integrated [employer] (In Clinic) "From scale of 1-10, how likely are you to follow plan?":   Helen Saxon, LCSW

## 2021-11-21 ENCOUNTER — Encounter: Payer: Self-pay | Admitting: *Deleted

## 2021-11-21 ENCOUNTER — Encounter: Payer: Self-pay | Admitting: Obstetrics and Gynecology

## 2021-11-21 ENCOUNTER — Other Ambulatory Visit: Payer: Self-pay | Admitting: *Deleted

## 2021-11-21 DIAGNOSIS — O99019 Anemia complicating pregnancy, unspecified trimester: Secondary | ICD-10-CM | POA: Insufficient documentation

## 2021-11-21 LAB — CERVICOVAGINAL ANCILLARY ONLY
Bacterial Vaginitis (gardnerella): NEGATIVE
Candida Glabrata: NEGATIVE
Candida Vaginitis: POSITIVE — AB
Chlamydia: NEGATIVE
Comment: NEGATIVE
Comment: NEGATIVE
Comment: NEGATIVE
Comment: NEGATIVE
Comment: NEGATIVE
Comment: NORMAL
Neisseria Gonorrhea: NEGATIVE
Trichomonas: NEGATIVE

## 2021-11-21 LAB — PREGNANCY, INITIAL SCREEN
Antibody Screen: NEGATIVE
Basophils Absolute: 0 10*3/uL (ref 0.0–0.2)
Basos: 0 %
Bilirubin, UA: NEGATIVE
Chlamydia trachomatis, NAA: NEGATIVE
EOS (ABSOLUTE): 0.1 10*3/uL (ref 0.0–0.4)
Eos: 1 %
Glucose, UA: NEGATIVE
HCV Ab: 0.1 s/co ratio (ref 0.0–0.9)
HIV Screen 4th Generation wRfx: NONREACTIVE
Hematocrit: 30.3 % — ABNORMAL LOW (ref 34.0–46.6)
Hemoglobin: 9.8 g/dL — ABNORMAL LOW (ref 11.1–15.9)
Hepatitis B Surface Ag: NEGATIVE
Immature Grans (Abs): 0.1 10*3/uL (ref 0.0–0.1)
Immature Granulocytes: 1 %
Ketones, UA: NEGATIVE
Leukocytes,UA: NEGATIVE
Lymphocytes Absolute: 1.5 10*3/uL (ref 0.7–3.1)
Lymphs: 18 %
MCH: 24.9 pg — ABNORMAL LOW (ref 26.6–33.0)
MCHC: 32.3 g/dL (ref 31.5–35.7)
MCV: 77 fL — ABNORMAL LOW (ref 79–97)
Monocytes Absolute: 0.8 10*3/uL (ref 0.1–0.9)
Monocytes: 11 %
Neisseria Gonorrhoeae by PCR: NEGATIVE
Neutrophils Absolute: 5.4 10*3/uL (ref 1.4–7.0)
Neutrophils: 69 %
Nitrite, UA: NEGATIVE
Platelets: 205 10*3/uL (ref 150–450)
RBC, UA: NEGATIVE
RBC: 3.93 x10E6/uL (ref 3.77–5.28)
RDW: 13.1 % (ref 11.7–15.4)
RPR Ser Ql: NONREACTIVE
Rh Factor: POSITIVE
Rubella Antibodies, IGG: 1.94 index (ref >0.99)
Specific Gravity, UA: >=1.03 — AB (ref 1.005–1.030)
Urobilinogen, Ur: 1 mg/dL (ref 0.2–1.0)
WBC: 7.9 10*3/uL (ref 3.4–10.8)
pH, UA: 6.5 (ref 5.0–7.5)

## 2021-11-21 LAB — HCV INTERPRETATION

## 2021-11-21 LAB — MICROSCOPIC EXAMINATION
Bacteria, UA: NONE SEEN
Casts: NONE SEEN /lpf
RBC, Urine: NONE SEEN /hpf (ref 0–2)

## 2021-11-21 LAB — CYTOLOGY - PAP: Diagnosis: NEGATIVE

## 2021-11-21 LAB — HEMOGLOBIN A1C
Est. average glucose Bld gHb Est-mCnc: 117 mg/dL
Hgb A1c MFr Bld: 5.7 % — ABNORMAL HIGH (ref 4.8–5.6)

## 2021-11-21 LAB — URINE CULTURE, OB REFLEX: Organism ID, Bacteria: NO GROWTH

## 2021-11-21 LAB — HEPATITIS C ANTIBODY: Hep C Virus Ab: 0.1 s/co ratio (ref 0.0–0.9)

## 2021-11-21 MED ORDER — FERROUS SULFATE 325 (65 FE) MG PO TABS: 325.0000 mg | ORAL_TABLET | ORAL | 3 refills | Status: DC

## 2021-11-21 MED ORDER — ASCORBIC ACID 500 MG PO TABS
500.0000 mg | ORAL_TABLET | ORAL | 3 refills | Status: DC
Start: 2021-11-21 — End: 2022-02-03

## 2021-11-22 ENCOUNTER — Ambulatory Visit: Payer: Medicaid Other | Attending: Obstetrics and Gynecology

## 2021-11-22 ENCOUNTER — Other Ambulatory Visit: Payer: Self-pay | Admitting: *Deleted

## 2021-11-22 ENCOUNTER — Ambulatory Visit: Payer: Medicaid Other | Attending: Obstetrics and Gynecology | Admitting: Maternal & Fetal Medicine

## 2021-11-22 ENCOUNTER — Encounter: Payer: Self-pay | Admitting: Obstetrics and Gynecology

## 2021-11-22 ENCOUNTER — Other Ambulatory Visit: Payer: Self-pay

## 2021-11-22 DIAGNOSIS — Z363 Encounter for antenatal screening for malformations: Secondary | ICD-10-CM | POA: Diagnosis not present

## 2021-11-22 DIAGNOSIS — O99322 Drug use complicating pregnancy, second trimester: Secondary | ICD-10-CM | POA: Diagnosis not present

## 2021-11-22 DIAGNOSIS — O34219 Maternal care for unspecified type scar from previous cesarean delivery: Secondary | ICD-10-CM | POA: Diagnosis not present

## 2021-11-22 DIAGNOSIS — Z3A28 28 weeks gestation of pregnancy: Secondary | ICD-10-CM | POA: Diagnosis not present

## 2021-11-22 DIAGNOSIS — Z3480 Encounter for supervision of other normal pregnancy, unspecified trimester: Secondary | ICD-10-CM

## 2021-11-22 DIAGNOSIS — O0933 Supervision of pregnancy with insufficient antenatal care, third trimester: Secondary | ICD-10-CM | POA: Insufficient documentation

## 2021-11-22 DIAGNOSIS — O0930 Supervision of pregnancy with insufficient antenatal care, unspecified trimester: Secondary | ICD-10-CM | POA: Diagnosis not present

## 2021-11-22 DIAGNOSIS — O365990 Maternal care for other known or suspected poor fetal growth, unspecified trimester, not applicable or unspecified: Secondary | ICD-10-CM | POA: Insufficient documentation

## 2021-11-22 DIAGNOSIS — O2690 Pregnancy related conditions, unspecified, unspecified trimester: Secondary | ICD-10-CM | POA: Insufficient documentation

## 2021-11-22 DIAGNOSIS — Z364 Encounter for antenatal screening for fetal growth retardation: Secondary | ICD-10-CM

## 2021-11-22 DIAGNOSIS — F1220 Cannabis dependence, uncomplicated: Secondary | ICD-10-CM

## 2021-11-22 DIAGNOSIS — O0932 Supervision of pregnancy with insufficient antenatal care, second trimester: Secondary | ICD-10-CM

## 2021-11-22 NOTE — Progress Notes (Signed)
MFM Brief Note  Helen Powell is a 30 yo G63 who is here with uncertain dates but EDD 02/16/22. She is seen today at the request Raelyn Mora, CNM  Single intrauterine pregnancy here for a follow up growth due to late prenatal care and uncertain LMP and no prior US exams.  Given that Helen Powell has an uncertain LMP we have dated the pregnancy by today's ultrasound with a EDD of 02/13/21  Normal anatomy with measurements less than dates due to FGR EFW 16th% with an AC at the 8th%. There is good fetal movement and amniotic fluid volume The UA Dopplers are normal without evidence of AEDF or REDF.  I discussed today's visit with a diagnosis of FGR. I explained that the etiology includes placental insufficiency, chronic disease, infection, aneuploidy and other genetic syndromes. She has a  NIPS pending.   She has no additional risk factors for chronic disease. Helen Powell has a small frame and I explained that I suspect that this is constitutional.   At this time I explained the diagnosis, evaluation and management to include on serial fetal growth and antenatal testing to include UA Dopplers.   Delivery is recommeded at 39 weeks.   All questions answered  I spent 20 minutes with > 50% in face to face consultation.  Novella Olive, MD

## 2021-11-23 ENCOUNTER — Other Ambulatory Visit: Payer: Self-pay | Admitting: Obstetrics and Gynecology

## 2021-11-23 DIAGNOSIS — B3731 Acute candidiasis of vulva and vagina: Secondary | ICD-10-CM

## 2021-11-26 ENCOUNTER — Other Ambulatory Visit (INDEPENDENT_AMBULATORY_CARE_PROVIDER_SITE_OTHER): Payer: Medicaid Other | Admitting: *Deleted

## 2021-11-26 ENCOUNTER — Other Ambulatory Visit: Payer: Self-pay

## 2021-11-26 DIAGNOSIS — Z3480 Encounter for supervision of other normal pregnancy, unspecified trimester: Secondary | ICD-10-CM

## 2021-11-26 NOTE — Progress Notes (Signed)
° °  Patient in clinic to complete 2 hour gtt.  Clovis Pu, RN

## 2021-11-27 LAB — GLUCOSE TOLERANCE, 2 HOURS W/ 1HR
Glucose, 1 hour: 121 mg/dL (ref 70–179)
Glucose, 2 hour: 88 mg/dL (ref 70–152)
Glucose, Fasting: 88 mg/dL (ref 70–91)

## 2021-12-06 ENCOUNTER — Ambulatory Visit: Payer: Medicaid Other | Attending: Maternal & Fetal Medicine

## 2021-12-06 ENCOUNTER — Ambulatory Visit (HOSPITAL_BASED_OUTPATIENT_CLINIC_OR_DEPARTMENT_OTHER): Payer: Medicaid Other | Admitting: *Deleted

## 2021-12-06 ENCOUNTER — Other Ambulatory Visit: Payer: Self-pay | Admitting: Maternal & Fetal Medicine

## 2021-12-06 ENCOUNTER — Other Ambulatory Visit: Payer: Self-pay

## 2021-12-06 ENCOUNTER — Ambulatory Visit: Payer: Medicaid Other | Admitting: *Deleted

## 2021-12-06 VITALS — BP 113/68 | HR 74

## 2021-12-06 DIAGNOSIS — D509 Iron deficiency anemia, unspecified: Secondary | ICD-10-CM

## 2021-12-06 DIAGNOSIS — O0930 Supervision of pregnancy with insufficient antenatal care, unspecified trimester: Secondary | ICD-10-CM | POA: Insufficient documentation

## 2021-12-06 DIAGNOSIS — O365930 Maternal care for other known or suspected poor fetal growth, third trimester, not applicable or unspecified: Secondary | ICD-10-CM | POA: Insufficient documentation

## 2021-12-06 DIAGNOSIS — F1290 Cannabis use, unspecified, uncomplicated: Secondary | ICD-10-CM | POA: Diagnosis present

## 2021-12-06 DIAGNOSIS — O0932 Supervision of pregnancy with insufficient antenatal care, second trimester: Secondary | ICD-10-CM | POA: Diagnosis not present

## 2021-12-06 DIAGNOSIS — O99320 Drug use complicating pregnancy, unspecified trimester: Secondary | ICD-10-CM

## 2021-12-06 DIAGNOSIS — F1220 Cannabis dependence, uncomplicated: Secondary | ICD-10-CM | POA: Diagnosis not present

## 2021-12-06 DIAGNOSIS — Z3A30 30 weeks gestation of pregnancy: Secondary | ICD-10-CM | POA: Diagnosis not present

## 2021-12-06 DIAGNOSIS — Z148 Genetic carrier of other disease: Secondary | ICD-10-CM | POA: Insufficient documentation

## 2021-12-06 DIAGNOSIS — Z3480 Encounter for supervision of other normal pregnancy, unspecified trimester: Secondary | ICD-10-CM | POA: Diagnosis present

## 2021-12-06 DIAGNOSIS — O365990 Maternal care for other known or suspected poor fetal growth, unspecified trimester, not applicable or unspecified: Secondary | ICD-10-CM

## 2021-12-06 DIAGNOSIS — O99019 Anemia complicating pregnancy, unspecified trimester: Secondary | ICD-10-CM | POA: Insufficient documentation

## 2021-12-06 DIAGNOSIS — O99323 Drug use complicating pregnancy, third trimester: Secondary | ICD-10-CM | POA: Insufficient documentation

## 2021-12-06 DIAGNOSIS — O2693 Pregnancy related conditions, unspecified, third trimester: Secondary | ICD-10-CM | POA: Insufficient documentation

## 2021-12-06 DIAGNOSIS — O0933 Supervision of pregnancy with insufficient antenatal care, third trimester: Secondary | ICD-10-CM | POA: Insufficient documentation

## 2021-12-06 DIAGNOSIS — O34219 Maternal care for unspecified type scar from previous cesarean delivery: Secondary | ICD-10-CM | POA: Insufficient documentation

## 2021-12-06 NOTE — Procedures (Signed)
Helen Powell 08/31/1991 [redacted]w[redacted]d  Fetus A Non-Stress Test Interpretation for 12/06/21  Indication: IUGR  Fetal Heart Rate A Mode: External Baseline Rate (A): 140 bpm Variability: Moderate Accelerations: 10 x 10 Decelerations: None Multiple birth?: No  Uterine Activity Mode: Palpation, Toco Contraction Frequency (min): 1 uc Contraction Duration (sec): 50 Contraction Quality: Mild Resting Tone Palpated: Relaxed Resting Time: Adequate  Interpretation (Fetal Testing) Nonstress Test Interpretation: Reactive Overall Impression: Reassuring for gestational age Comments: Dr. Grace Bushy reviewed tracing

## 2021-12-09 ENCOUNTER — Other Ambulatory Visit: Payer: Self-pay | Admitting: *Deleted

## 2021-12-09 DIAGNOSIS — O99320 Drug use complicating pregnancy, unspecified trimester: Secondary | ICD-10-CM

## 2021-12-09 DIAGNOSIS — R7303 Prediabetes: Secondary | ICD-10-CM

## 2021-12-09 DIAGNOSIS — O365930 Maternal care for other known or suspected poor fetal growth, third trimester, not applicable or unspecified: Secondary | ICD-10-CM

## 2021-12-09 DIAGNOSIS — O0933 Supervision of pregnancy with insufficient antenatal care, third trimester: Secondary | ICD-10-CM

## 2021-12-09 DIAGNOSIS — D563 Thalassemia minor: Secondary | ICD-10-CM

## 2021-12-09 DIAGNOSIS — O34219 Maternal care for unspecified type scar from previous cesarean delivery: Secondary | ICD-10-CM

## 2021-12-09 DIAGNOSIS — Z98891 History of uterine scar from previous surgery: Secondary | ICD-10-CM

## 2021-12-16 ENCOUNTER — Ambulatory Visit: Payer: Medicaid Other

## 2021-12-19 ENCOUNTER — Encounter: Payer: Self-pay | Admitting: *Deleted

## 2021-12-19 ENCOUNTER — Other Ambulatory Visit: Payer: Self-pay | Admitting: *Deleted

## 2021-12-19 ENCOUNTER — Other Ambulatory Visit: Payer: Self-pay

## 2021-12-19 ENCOUNTER — Ambulatory Visit: Payer: Medicaid Other | Attending: Maternal & Fetal Medicine | Admitting: *Deleted

## 2021-12-19 ENCOUNTER — Other Ambulatory Visit: Payer: Self-pay | Admitting: Maternal & Fetal Medicine

## 2021-12-19 ENCOUNTER — Ambulatory Visit: Payer: Medicaid Other | Admitting: *Deleted

## 2021-12-19 ENCOUNTER — Ambulatory Visit (HOSPITAL_BASED_OUTPATIENT_CLINIC_OR_DEPARTMENT_OTHER): Payer: Medicaid Other

## 2021-12-19 VITALS — BP 98/57 | HR 66

## 2021-12-19 DIAGNOSIS — Z3A32 32 weeks gestation of pregnancy: Secondary | ICD-10-CM | POA: Insufficient documentation

## 2021-12-19 DIAGNOSIS — R7303 Prediabetes: Secondary | ICD-10-CM | POA: Diagnosis not present

## 2021-12-19 DIAGNOSIS — Z148 Genetic carrier of other disease: Secondary | ICD-10-CM | POA: Diagnosis not present

## 2021-12-19 DIAGNOSIS — D563 Thalassemia minor: Secondary | ICD-10-CM

## 2021-12-19 DIAGNOSIS — O99891 Other specified diseases and conditions complicating pregnancy: Secondary | ICD-10-CM | POA: Insufficient documentation

## 2021-12-19 DIAGNOSIS — Z3480 Encounter for supervision of other normal pregnancy, unspecified trimester: Secondary | ICD-10-CM

## 2021-12-19 DIAGNOSIS — N858 Other specified noninflammatory disorders of uterus: Secondary | ICD-10-CM | POA: Diagnosis not present

## 2021-12-19 DIAGNOSIS — O365930 Maternal care for other known or suspected poor fetal growth, third trimester, not applicable or unspecified: Secondary | ICD-10-CM

## 2021-12-19 DIAGNOSIS — O0933 Supervision of pregnancy with insufficient antenatal care, third trimester: Secondary | ICD-10-CM | POA: Insufficient documentation

## 2021-12-19 DIAGNOSIS — O99320 Drug use complicating pregnancy, unspecified trimester: Secondary | ICD-10-CM

## 2021-12-19 DIAGNOSIS — O0930 Supervision of pregnancy with insufficient antenatal care, unspecified trimester: Secondary | ICD-10-CM

## 2021-12-19 DIAGNOSIS — O99323 Drug use complicating pregnancy, third trimester: Secondary | ICD-10-CM | POA: Diagnosis not present

## 2021-12-19 DIAGNOSIS — Z98891 History of uterine scar from previous surgery: Secondary | ICD-10-CM

## 2021-12-19 DIAGNOSIS — O34219 Maternal care for unspecified type scar from previous cesarean delivery: Secondary | ICD-10-CM | POA: Insufficient documentation

## 2021-12-19 DIAGNOSIS — O0932 Supervision of pregnancy with insufficient antenatal care, second trimester: Secondary | ICD-10-CM

## 2021-12-19 DIAGNOSIS — O365990 Maternal care for other known or suspected poor fetal growth, unspecified trimester, not applicable or unspecified: Secondary | ICD-10-CM

## 2021-12-19 DIAGNOSIS — F1220 Cannabis dependence, uncomplicated: Secondary | ICD-10-CM | POA: Diagnosis not present

## 2021-12-19 DIAGNOSIS — D509 Iron deficiency anemia, unspecified: Secondary | ICD-10-CM

## 2021-12-19 NOTE — Procedures (Signed)
Helen Powell 1991/09/10 [redacted]w[redacted]d  Fetus A Non-Stress Test Interpretation for 12/19/21  Indication: IUGR  Fetal Heart Rate A Mode: External Baseline Rate (A): 135 bpm Variability: Moderate Accelerations: 15 x 15 Decelerations: None  Uterine Activity Mode: Toco Contraction Frequency (min): 4-15 Contraction Duration (sec): 40-120 Contraction Quality: Mild (pt has felt 1 u/c during this NST) Resting Tone Palpated: Relaxed  Interpretation (Fetal Testing) Nonstress Test Interpretation: Reactive Overall Impression: Reassuring for gestational age Comments: tracing reviewed by Dr. Parke Poisson

## 2021-12-23 ENCOUNTER — Ambulatory Visit: Payer: Medicaid Other

## 2021-12-24 ENCOUNTER — Telehealth: Payer: Self-pay | Admitting: Obstetrics and Gynecology

## 2021-12-24 NOTE — Telephone Encounter (Signed)
Patient requested to reschedule her appointment due to being [place] of state. She stated she wasn't sure when she would be back. However, she was scheduled with the next available appointment.

## 2021-12-25 ENCOUNTER — Encounter: Payer: Medicaid Other | Admitting: Certified Nurse Midwife

## 2021-12-25 ENCOUNTER — Ambulatory Visit: Payer: Medicaid Other | Admitting: Licensed Clinical Social Worker

## 2022-01-09 ENCOUNTER — Other Ambulatory Visit: Payer: Self-pay

## 2022-01-09 ENCOUNTER — Ambulatory Visit: Payer: Medicaid Other | Admitting: *Deleted

## 2022-01-09 ENCOUNTER — Ambulatory Visit: Payer: Medicaid Other | Attending: Obstetrics

## 2022-01-09 ENCOUNTER — Other Ambulatory Visit: Payer: Self-pay | Admitting: Obstetrics

## 2022-01-09 VITALS — BP 109/65 | HR 102

## 2022-01-09 DIAGNOSIS — Z3A35 35 weeks gestation of pregnancy: Secondary | ICD-10-CM

## 2022-01-09 DIAGNOSIS — D563 Thalassemia minor: Secondary | ICD-10-CM | POA: Insufficient documentation

## 2022-01-09 DIAGNOSIS — O99891 Other specified diseases and conditions complicating pregnancy: Secondary | ICD-10-CM | POA: Diagnosis not present

## 2022-01-09 DIAGNOSIS — O365990 Maternal care for other known or suspected poor fetal growth, unspecified trimester, not applicable or unspecified: Secondary | ICD-10-CM

## 2022-01-09 DIAGNOSIS — Z3480 Encounter for supervision of other normal pregnancy, unspecified trimester: Secondary | ICD-10-CM | POA: Insufficient documentation

## 2022-01-09 DIAGNOSIS — O34219 Maternal care for unspecified type scar from previous cesarean delivery: Secondary | ICD-10-CM

## 2022-01-09 DIAGNOSIS — O99323 Drug use complicating pregnancy, third trimester: Secondary | ICD-10-CM | POA: Insufficient documentation

## 2022-01-09 DIAGNOSIS — F1290 Cannabis use, unspecified, uncomplicated: Secondary | ICD-10-CM

## 2022-01-09 DIAGNOSIS — O99320 Drug use complicating pregnancy, unspecified trimester: Secondary | ICD-10-CM

## 2022-01-09 DIAGNOSIS — R7303 Prediabetes: Secondary | ICD-10-CM | POA: Diagnosis not present

## 2022-01-09 DIAGNOSIS — O365930 Maternal care for other known or suspected poor fetal growth, third trimester, not applicable or unspecified: Secondary | ICD-10-CM | POA: Diagnosis not present

## 2022-01-09 DIAGNOSIS — O0933 Supervision of pregnancy with insufficient antenatal care, third trimester: Secondary | ICD-10-CM | POA: Diagnosis not present

## 2022-01-09 DIAGNOSIS — O0930 Supervision of pregnancy with insufficient antenatal care, unspecified trimester: Secondary | ICD-10-CM

## 2022-01-09 DIAGNOSIS — O99019 Anemia complicating pregnancy, unspecified trimester: Secondary | ICD-10-CM | POA: Insufficient documentation

## 2022-01-09 DIAGNOSIS — D509 Iron deficiency anemia, unspecified: Secondary | ICD-10-CM | POA: Insufficient documentation

## 2022-01-10 ENCOUNTER — Telehealth: Payer: Self-pay | Admitting: *Deleted

## 2022-01-10 ENCOUNTER — Other Ambulatory Visit: Payer: Self-pay | Admitting: *Deleted

## 2022-01-10 DIAGNOSIS — O365930 Maternal care for other known or suspected poor fetal growth, third trimester, not applicable or unspecified: Secondary | ICD-10-CM

## 2022-01-10 NOTE — Telephone Encounter (Signed)
Called patient regarding receiving message from after hours triage line. Patient reported having contractions and receiving medication, she needed follow up. Left message stating no appointments today, she has a follow up 01/16/2022 at 4 PM. Advised if she is having 6 or more contractions in one hour and very painful, contractions are not relieved with Tylenol, rest or increased fluids, patient to go to [facility].  Derl Barrow, RN

## 2022-01-16 ENCOUNTER — Other Ambulatory Visit (HOSPITAL_COMMUNITY)
Admission: RE | Admit: 2022-01-16 | Discharge: 2022-01-16 | Disposition: A | Discharge status: Home / Self Care | Payer: Medicaid Other | Source: Ambulatory Visit | Attending: Obstetrics and Gynecology | Admitting: Obstetrics and Gynecology

## 2022-01-16 ENCOUNTER — Ambulatory Visit (INDEPENDENT_AMBULATORY_CARE_PROVIDER_SITE_OTHER): Payer: Medicaid Other | Admitting: Obstetrics and Gynecology

## 2022-01-16 ENCOUNTER — Other Ambulatory Visit: Payer: Self-pay

## 2022-01-16 VITALS — BP 111/69 | HR 77 | Temp 98.1°F | Wt 117.4 lb

## 2022-01-16 DIAGNOSIS — D649 Anemia, unspecified: Secondary | ICD-10-CM | POA: Insufficient documentation

## 2022-01-16 DIAGNOSIS — O99320 Drug use complicating pregnancy, unspecified trimester: Secondary | ICD-10-CM | POA: Diagnosis not present

## 2022-01-16 DIAGNOSIS — O99013 Anemia complicating pregnancy, third trimester: Secondary | ICD-10-CM | POA: Diagnosis not present

## 2022-01-16 DIAGNOSIS — F1290 Cannabis use, unspecified, uncomplicated: Secondary | ICD-10-CM

## 2022-01-16 DIAGNOSIS — O0933 Supervision of pregnancy with insufficient antenatal care, third trimester: Secondary | ICD-10-CM

## 2022-01-16 DIAGNOSIS — Z3480 Encounter for supervision of other normal pregnancy, unspecified trimester: Secondary | ICD-10-CM

## 2022-01-16 DIAGNOSIS — Z3A36 36 weeks gestation of pregnancy: Secondary | ICD-10-CM

## 2022-01-16 DIAGNOSIS — O99019 Anemia complicating pregnancy, unspecified trimester: Secondary | ICD-10-CM

## 2022-01-16 NOTE — Progress Notes (Signed)
° °  LOW-RISK PREGNANCY OFFICE VISIT Patient name: Helen Powell MRN [id number]  Date of birth: [birth year] Chief Complaint:   Routine Prenatal Visit  History of Present Illness:   Helen Powell is a 31 y.o. [id number] female at [redacted]w[redacted]d with an Estimated Date of Delivery: 02/13/22 being seen today for ongoing management of a low-risk pregnancy.  Today she reports no complaints. Contractions: Irritability. Vag. Bleeding: None.  Movement: Present. denies leaking of fluid. Review of Systems:   Pertinent items are noted in HPI Denies abnormal vaginal discharge w/ itching/odor/irritation, headaches, visual changes, shortness of breath, chest pain, abdominal pain, severe nausea/vomiting, or problems with urination or bowel movements unless otherwise stated above. Pertinent History Reviewed:  Reviewed past medical,surgical, social, obstetrical and family history.  Reviewed problem list, medications and allergies. Physical Assessment:   Vitals:   01/16/22 1626  BP: 111/69  Pulse: 77  Temp: 98.1 F (36.7 C)  Weight: 117 lb 6.4 oz (53.3 kg)  Body mass index is 19.54 kg/m.        Physical Examination:   General appearance: Well appearing, and in no distress  Mental status: Alert, oriented to person, place, and time  Skin: Warm & dry  Cardiovascular: Normal heart rate noted  Respiratory: Normal respiratory effort, no distress  Abdomen: Soft, gravid, nontender  Pelvic: Cervical exam performed>>declined       Extremities: Edema: None  Fetal Status: Fetal Heart Rate (bpm): 145 Fundal Height: 34 cm Movement: Present Presentation: Vertex  No results found for this or any previous visit (from the past 24 hour(s)).  Assessment & Plan:  1) Low-risk pregnancy [id number] at [redacted]w[redacted]d with an Estimated Date of Delivery: 02/13/22   2) Supervision of other normal pregnancy, antepartum - Cervicovaginal ancillary only,  - Strep Gp B NAA  3) [redacted] weeks gestation of pregnancy   4) Late prenatal care affecting  pregnancy in third trimester  5) Marijuana use during pregnancy  6) Anemia of mother in pregnancy, antepartum    Meds: No orders of the defined types were placed in this encounter.  Labs/procedures today: GC/CT and GBS  Plan:  Continue routine obstetrical care   Reviewed: Preterm labor symptoms and general obstetric precautions including but not limited to vaginal bleeding, contractions, leaking of fluid and fetal movement were reviewed in detail with the patient.  All questions were answered. Has home bp cuff. Check bp weekly, let us know if >140/90.   Follow-up: Return in about 1 week (around 01/23/2022) for Return OB visit.  Orders Placed This Encounter  Procedures   Strep Gp B NAA   Raelyn Mora MSN, CNM 01/16/2022 4:54 PM

## 2022-01-17 ENCOUNTER — Encounter: Payer: Self-pay | Admitting: *Deleted

## 2022-01-17 ENCOUNTER — Ambulatory Visit: Payer: Medicaid Other | Attending: Obstetrics

## 2022-01-17 ENCOUNTER — Other Ambulatory Visit: Payer: Self-pay | Admitting: *Deleted

## 2022-01-17 ENCOUNTER — Encounter: Payer: Self-pay | Admitting: Obstetrics and Gynecology

## 2022-01-17 ENCOUNTER — Ambulatory Visit: Payer: Medicaid Other | Admitting: *Deleted

## 2022-01-17 VITALS — BP 102/62 | HR 72

## 2022-01-17 DIAGNOSIS — O0933 Supervision of pregnancy with insufficient antenatal care, third trimester: Secondary | ICD-10-CM | POA: Diagnosis not present

## 2022-01-17 DIAGNOSIS — O365930 Maternal care for other known or suspected poor fetal growth, third trimester, not applicable or unspecified: Secondary | ICD-10-CM | POA: Diagnosis present

## 2022-01-17 DIAGNOSIS — O34219 Maternal care for unspecified type scar from previous cesarean delivery: Secondary | ICD-10-CM | POA: Insufficient documentation

## 2022-01-17 DIAGNOSIS — O99323 Drug use complicating pregnancy, third trimester: Secondary | ICD-10-CM | POA: Diagnosis not present

## 2022-01-17 DIAGNOSIS — D509 Iron deficiency anemia, unspecified: Secondary | ICD-10-CM

## 2022-01-17 DIAGNOSIS — Z148 Genetic carrier of other disease: Secondary | ICD-10-CM | POA: Insufficient documentation

## 2022-01-17 DIAGNOSIS — Z3A36 36 weeks gestation of pregnancy: Secondary | ICD-10-CM | POA: Insufficient documentation

## 2022-01-17 DIAGNOSIS — O0930 Supervision of pregnancy with insufficient antenatal care, unspecified trimester: Secondary | ICD-10-CM

## 2022-01-17 DIAGNOSIS — O99019 Anemia complicating pregnancy, unspecified trimester: Secondary | ICD-10-CM

## 2022-01-17 DIAGNOSIS — Z3480 Encounter for supervision of other normal pregnancy, unspecified trimester: Secondary | ICD-10-CM

## 2022-01-17 DIAGNOSIS — O99320 Drug use complicating pregnancy, unspecified trimester: Secondary | ICD-10-CM

## 2022-01-17 LAB — OB RESULTS CONSOLE GC/CHLAMYDIA: Gonorrhea: NEGATIVE

## 2022-01-19 LAB — STREP GP B NAA: Strep Gp B NAA: NEGATIVE

## 2022-01-20 LAB — CERVICOVAGINAL ANCILLARY ONLY
Chlamydia: NEGATIVE
Comment: NEGATIVE
Comment: NORMAL
Neisseria Gonorrhea: NEGATIVE

## 2022-01-22 ENCOUNTER — Encounter: Payer: Medicaid Other | Admitting: Certified Nurse Midwife

## 2022-01-23 ENCOUNTER — Ambulatory Visit: Payer: Medicaid Other | Attending: Obstetrics

## 2022-01-23 ENCOUNTER — Other Ambulatory Visit: Payer: Self-pay

## 2022-01-23 ENCOUNTER — Ambulatory Visit: Payer: Medicaid Other | Admitting: *Deleted

## 2022-01-23 VITALS — BP 122/71 | HR 86

## 2022-01-23 DIAGNOSIS — O0933 Supervision of pregnancy with insufficient antenatal care, third trimester: Secondary | ICD-10-CM | POA: Diagnosis not present

## 2022-01-23 DIAGNOSIS — O365930 Maternal care for other known or suspected poor fetal growth, third trimester, not applicable or unspecified: Secondary | ICD-10-CM | POA: Diagnosis present

## 2022-01-23 DIAGNOSIS — Z3480 Encounter for supervision of other normal pregnancy, unspecified trimester: Secondary | ICD-10-CM

## 2022-01-23 DIAGNOSIS — Z3A37 37 weeks gestation of pregnancy: Secondary | ICD-10-CM | POA: Diagnosis not present

## 2022-01-23 DIAGNOSIS — Z148 Genetic carrier of other disease: Secondary | ICD-10-CM

## 2022-01-23 DIAGNOSIS — O34219 Maternal care for unspecified type scar from previous cesarean delivery: Secondary | ICD-10-CM | POA: Insufficient documentation

## 2022-01-23 DIAGNOSIS — D509 Iron deficiency anemia, unspecified: Secondary | ICD-10-CM | POA: Insufficient documentation

## 2022-01-23 DIAGNOSIS — O99323 Drug use complicating pregnancy, third trimester: Secondary | ICD-10-CM | POA: Diagnosis not present

## 2022-01-23 DIAGNOSIS — O99320 Drug use complicating pregnancy, unspecified trimester: Secondary | ICD-10-CM | POA: Insufficient documentation

## 2022-01-23 DIAGNOSIS — F1290 Cannabis use, unspecified, uncomplicated: Secondary | ICD-10-CM

## 2022-01-23 DIAGNOSIS — O0930 Supervision of pregnancy with insufficient antenatal care, unspecified trimester: Secondary | ICD-10-CM

## 2022-01-23 DIAGNOSIS — O99019 Anemia complicating pregnancy, unspecified trimester: Secondary | ICD-10-CM

## 2022-01-27 ENCOUNTER — Encounter: Payer: Self-pay | Admitting: Advanced Practice Midwife

## 2022-01-28 ENCOUNTER — Other Ambulatory Visit: Payer: Self-pay

## 2022-01-28 ENCOUNTER — Ambulatory Visit (INDEPENDENT_AMBULATORY_CARE_PROVIDER_SITE_OTHER): Payer: Medicaid Other | Admitting: Advanced Practice Midwife

## 2022-01-28 VITALS — BP 113/70 | HR 99 | Wt 117.0 lb

## 2022-01-28 DIAGNOSIS — Z3009 Encounter for other general counseling and advice on contraception: Secondary | ICD-10-CM

## 2022-01-28 DIAGNOSIS — Z3A37 37 weeks gestation of pregnancy: Secondary | ICD-10-CM

## 2022-01-28 DIAGNOSIS — Z3480 Encounter for supervision of other normal pregnancy, unspecified trimester: Secondary | ICD-10-CM | POA: Diagnosis not present

## 2022-01-28 DIAGNOSIS — Z98891 History of uterine scar from previous surgery: Secondary | ICD-10-CM

## 2022-01-28 NOTE — Progress Notes (Signed)
? ?  PRENATAL VISIT NOTE ? ?Subjective:  ?Helen Powell is a 31 y.o. [id number] at [redacted]w[redacted]d being seen today for ongoing prenatal care.  She is currently monitored for the following issues for this high-risk pregnancy and has Supervision of other normal pregnancy, antepartum; Late prenatal care affecting pregnancy, antepartum; Marijuana use during pregnancy; Delivery of second pregnancy by cesarean section using transverse incision of lower segment of uterus; Delivery of first pregnancy by cesarean section using transverse incision of lower segment of uterus; Delivery of third pregnancy by cesarean section using transverse incision of lower segment of uterus; Iron deficiency anemia during pregnancy; Fetal growth restriction antepartum; and Unwanted fertility on their problem list. ? ?Patient reports no complaints.  Contractions: Irritability. Vag. Bleeding: None.  Movement: Present. Denies leaking of fluid.  ? ?The following portions of the patient's history were reviewed and updated as appropriate: allergies, current medications, past family history, past medical history, past social history, past surgical history and problem list. Problem list updated. ? ?Objective:  ? ?Vitals:  ? 01/28/22 1118  ?BP: 113/70  ?Pulse: 99  ?Weight: 117 lb (53.1 kg)  ? ? ?Fetal Status: Fetal Heart Rate (bpm): 141   Movement: Present    ? ?General:  Alert, oriented and cooperative. Patient is in no acute distress.  ?Skin: Skin is warm and dry. No rash noted.   ?Cardiovascular: Normal heart rate noted  ?Respiratory: Normal respiratory effort, no problems with respiration noted  ?Abdomen: Soft, gravid, appropriate for gestational age.  Pain/Pressure: Present     ?Pelvic: Cervical exam deferred        ?Extremities: Normal range of motion.     ?Mental Status: Normal mood and affect. Normal behavior. Normal judgment and thought content.  ? ?Assessment and Plan:  ?Pregnancy: [id number] at [redacted]w[redacted]d ? ?1. Supervision of other normal pregnancy,  antepartum ?- No acute concerns ? ?2. Unwanted fertility ?- Continues to desire BTL,  consent signed 11/20/2021 ? ?3. Hx of cesarean section ?- x 3, for repeat ? ?4. FGR ?- Care previously coordinated with Dr. Donalee Citrin ?- Recommend delivery at 67 weeks, earliest available for repeat cesarean is 03/11 ? ?Term labor symptoms and general obstetric precautions including but not limited to vaginal bleeding, contractions, leaking of fluid and fetal movement were reviewed in detail with the patient. ?Please refer to After Visit Summary for other counseling recommendations.  ? ?Final prenatal appointment. Patient's cesarean is 03/11!!!. ? ?Darlina Rumpf, CNM ? ?

## 2022-01-28 NOTE — Progress Notes (Signed)
PHQ9/GAD7 score were discussed with patient. She stated that " I don't need to talk to anyone, i'm fine"  ? ? ?

## 2022-01-29 NOTE — Patient Instructions (Signed)
Aerin Mcauley ? [id number] ? ? Your procedure is scheduled on:  02/01/2022 ? Arrive at [facility] at [place] on [address] at [facility]  and [address]. You are invited to use the FREE valet parking or use the Visitor's parking deck. ? Pick up the phone at the desk and dial [phone]. ? Call this number if you have problems the morning of surgery: [phone] ? Remember: ? ? Do not eat food:(After Midnight) Desp?s de medianoche. ? Do not drink clear liquids: (After Midnight) Desp?s de medianoche. ? Take these medicines the morning of surgery with A SIP OF WATER:  none ? ? Do not wear jewelry, make-up or nail polish. ? Do not wear lotions, powders, or perfumes. Do not wear deodorant. ? Do not shave 48 hours prior to surgery. ? Do not bring valuables to the hospital.  [facility] is not  ? responsible for any belongings or valuables brought to the hospital. ? Contacts, dentures or bridgework may not be worn into surgery. ? Leave suitcase in the car. After surgery it may be brought to your room. ? For patients admitted to the hospital, checkout time is 11:00 AM the day of  ?            discharge. ? ?   ? Please read over the following fact sheets that you were given:  ?   Preparing for Surgery ? ? ?

## 2022-01-30 ENCOUNTER — Encounter (HOSPITAL_COMMUNITY): Payer: Self-pay | Admitting: Obstetrics & Gynecology

## 2022-01-30 ENCOUNTER — Other Ambulatory Visit (HOSPITAL_COMMUNITY)
Admission: RE | Admit: 2022-01-30 | Discharge: 2022-01-30 | Disposition: A | Discharge status: Home / Self Care | Payer: Medicaid Other | Source: Ambulatory Visit | Attending: Obstetrics & Gynecology | Admitting: Obstetrics & Gynecology

## 2022-01-31 ENCOUNTER — Telehealth (HOSPITAL_COMMUNITY): Payer: Self-pay | Admitting: *Deleted

## 2022-01-31 ENCOUNTER — Ambulatory Visit: Payer: Medicaid Other

## 2022-01-31 ENCOUNTER — Encounter (HOSPITAL_COMMUNITY): Payer: Self-pay

## 2022-01-31 NOTE — Telephone Encounter (Signed)
Preadmission screen  

## 2022-01-31 NOTE — Pre-Procedure Instructions (Signed)
3/8 message left ?3/9 2 messages left ? ?3/10 one message left with arrival time NPO status ?

## 2022-02-01 ENCOUNTER — Encounter (HOSPITAL_COMMUNITY)
Admission: RE | Disposition: A | Discharge status: Home / Self Care | Payer: Self-pay | Source: Home / Self Care | Attending: Obstetrics & Gynecology

## 2022-02-01 ENCOUNTER — Inpatient Hospital Stay (HOSPITAL_COMMUNITY): Payer: Medicaid Other | Admitting: Anesthesiology

## 2022-02-01 ENCOUNTER — Inpatient Hospital Stay (HOSPITAL_COMMUNITY)
Admission: RE | Admit: 2022-02-01 | Discharge: 2022-02-03 | DRG: 784 | Disposition: A | Discharge status: Home / Self Care | Payer: Medicaid Other | Attending: Obstetrics & Gynecology | Admitting: Obstetrics & Gynecology

## 2022-02-01 ENCOUNTER — Other Ambulatory Visit: Payer: Self-pay

## 2022-02-01 ENCOUNTER — Encounter (HOSPITAL_COMMUNITY): Payer: Self-pay | Admitting: Obstetrics & Gynecology

## 2022-02-01 DIAGNOSIS — Z3A38 38 weeks gestation of pregnancy: Secondary | ICD-10-CM

## 2022-02-01 DIAGNOSIS — Z20822 Contact with and (suspected) exposure to covid-19: Secondary | ICD-10-CM | POA: Diagnosis present

## 2022-02-01 DIAGNOSIS — O9902 Anemia complicating childbirth: Secondary | ICD-10-CM | POA: Diagnosis present

## 2022-02-01 DIAGNOSIS — O34211 Maternal care for low transverse scar from previous cesarean delivery: Secondary | ICD-10-CM | POA: Diagnosis present

## 2022-02-01 DIAGNOSIS — O99324 Drug use complicating childbirth: Secondary | ICD-10-CM | POA: Diagnosis present

## 2022-02-01 DIAGNOSIS — Z302 Encounter for sterilization: Secondary | ICD-10-CM | POA: Diagnosis not present

## 2022-02-01 DIAGNOSIS — O99019 Anemia complicating pregnancy, unspecified trimester: Secondary | ICD-10-CM

## 2022-02-01 DIAGNOSIS — Z3009 Encounter for other general counseling and advice on contraception: Secondary | ICD-10-CM | POA: Diagnosis present

## 2022-02-01 DIAGNOSIS — O365930 Maternal care for other known or suspected poor fetal growth, third trimester, not applicable or unspecified: Secondary | ICD-10-CM | POA: Diagnosis present

## 2022-02-01 DIAGNOSIS — Z3480 Encounter for supervision of other normal pregnancy, unspecified trimester: Principal | ICD-10-CM

## 2022-02-01 DIAGNOSIS — O0930 Supervision of pregnancy with insufficient antenatal care, unspecified trimester: Secondary | ICD-10-CM

## 2022-02-01 DIAGNOSIS — O99320 Drug use complicating pregnancy, unspecified trimester: Secondary | ICD-10-CM

## 2022-02-01 DIAGNOSIS — Z98891 History of uterine scar from previous surgery: Secondary | ICD-10-CM

## 2022-02-01 DIAGNOSIS — F1290 Cannabis use, unspecified, uncomplicated: Secondary | ICD-10-CM | POA: Diagnosis present

## 2022-02-01 DIAGNOSIS — O365990 Maternal care for other known or suspected poor fetal growth, unspecified trimester, not applicable or unspecified: Secondary | ICD-10-CM | POA: Diagnosis present

## 2022-02-01 DIAGNOSIS — D509 Iron deficiency anemia, unspecified: Secondary | ICD-10-CM | POA: Diagnosis present

## 2022-02-01 LAB — CBC
HCT: 30 % — ABNORMAL LOW (ref 36.0–46.0)
Hemoglobin: 9.1 g/dL — ABNORMAL LOW (ref 12.0–15.0)
MCH: 22.9 pg — ABNORMAL LOW (ref 26.0–34.0)
MCHC: 30.3 g/dL (ref 30.0–36.0)
MCV: 75.4 fL — ABNORMAL LOW (ref 80.0–100.0)
Platelets: 160 10*3/uL (ref 150–400)
RBC: 3.98 MIL/uL (ref 3.87–5.11)
RDW: 15.6 % — ABNORMAL HIGH (ref 11.5–15.5)
WBC: 6.4 10*3/uL (ref 4.0–10.5)
nRBC: 0 % (ref 0.0–0.2)

## 2022-02-01 LAB — TYPE AND SCREEN
ABO/RH(D): O POS
Antibody Screen: NEGATIVE

## 2022-02-01 LAB — RESP PANEL BY RT-PCR (FLU A&B, COVID) ARPGX2
Influenza A by PCR: NEGATIVE
Influenza B by PCR: NEGATIVE
SARS Coronavirus 2 by RT PCR: NEGATIVE

## 2022-02-01 SURGERY — Surgical Case
Anesthesia: Spinal

## 2022-02-01 MED ORDER — TETANUS-DIPHTH-ACELL PERTUSSIS 5-2.5-18.5 LF-MCG/0.5 IM SUSY: 0.5000 mL | PREFILLED_SYRINGE | Freq: Once | INTRAMUSCULAR | Status: DC

## 2022-02-01 MED ORDER — ACETAMINOPHEN 500 MG PO TABS
ORAL_TABLET | ORAL | Status: AC
  Filled 2022-02-01: qty 2

## 2022-02-01 MED ORDER — DIPHENHYDRAMINE HCL 50 MG/ML IJ SOLN
12.5000 mg | INTRAMUSCULAR | Status: DC | PRN
  Administered 2022-02-01: 12.5 mg via INTRAVENOUS
  Filled 2022-02-01: qty 1

## 2022-02-01 MED ORDER — MORPHINE SULFATE (PF) 0.5 MG/ML IJ SOLN
INTRAMUSCULAR | Status: DC | PRN
  Administered 2022-02-01: 150 ug via INTRATHECAL

## 2022-02-01 MED ORDER — GABAPENTIN 100 MG PO CAPS
100.0000 mg | ORAL_CAPSULE | Freq: Two times a day (BID) | ORAL | Status: DC
  Administered 2022-02-01 – 2022-02-03 (×4): 100 mg via ORAL
  Filled 2022-02-01 (×4): qty 1

## 2022-02-01 MED ORDER — NALOXONE HCL 0.4 MG/ML IJ SOLN: 0.4000 mg | INTRAMUSCULAR | Status: DC | PRN

## 2022-02-01 MED ORDER — DEXAMETHASONE SODIUM PHOSPHATE 4 MG/ML IJ SOLN
INTRAMUSCULAR | Status: AC
  Filled 2022-02-01: qty 1

## 2022-02-01 MED ORDER — PROPOFOL 10 MG/ML IV BOLUS
INTRAVENOUS | Status: AC
  Filled 2022-02-01: qty 20

## 2022-02-01 MED ORDER — MORPHINE SULFATE (PF) 0.5 MG/ML IJ SOLN
INTRAMUSCULAR | Status: AC
  Filled 2022-02-01: qty 10

## 2022-02-01 MED ORDER — SIMETHICONE 80 MG PO CHEW: 80.0000 mg | CHEWABLE_TABLET | ORAL | Status: DC | PRN

## 2022-02-01 MED ORDER — SOD CITRATE-CITRIC ACID 500-334 MG/5ML PO SOLN
30.0000 mL | Freq: Once | ORAL | Status: AC
  Administered 2022-02-01: 30 mL via ORAL

## 2022-02-01 MED ORDER — ZOLPIDEM TARTRATE 5 MG PO TABS
5.0000 mg | ORAL_TABLET | Freq: Every evening | ORAL | Status: DC | PRN
Start: 2022-02-01 — End: 2022-02-03

## 2022-02-01 MED ORDER — DIBUCAINE (PERIANAL) 1 % EX OINT: 1.0000 "application " | TOPICAL_OINTMENT | CUTANEOUS | Status: DC | PRN

## 2022-02-01 MED ORDER — KETOROLAC TROMETHAMINE 30 MG/ML IJ SOLN
INTRAMUSCULAR | Status: AC
  Filled 2022-02-01: qty 1

## 2022-02-01 MED ORDER — IBUPROFEN 600 MG PO TABS
600.0000 mg | ORAL_TABLET | Freq: Four times a day (QID) | ORAL | Status: DC
  Administered 2022-02-01 – 2022-02-03 (×7): 600 mg via ORAL
  Filled 2022-02-01 (×8): qty 1

## 2022-02-01 MED ORDER — SCOPOLAMINE 1 MG/3DAYS TD PT72: 1.0000 | MEDICATED_PATCH | Freq: Once | TRANSDERMAL | Status: DC

## 2022-02-01 MED ORDER — NALOXONE HCL 4 MG/10ML IJ SOLN
1.0000 ug/kg/h | INTRAVENOUS | Status: DC | PRN
  Filled 2022-02-01: qty 5

## 2022-02-01 MED ORDER — OXYCODONE HCL 5 MG PO TABS
5.0000 mg | ORAL_TABLET | ORAL | Status: DC | PRN
  Administered 2022-02-02: 5 mg via ORAL
  Filled 2022-02-01: qty 1

## 2022-02-01 MED ORDER — ONDANSETRON HCL 4 MG/2ML IJ SOLN
INTRAMUSCULAR | Status: AC
  Filled 2022-02-01: qty 2

## 2022-02-01 MED ORDER — DIPHENHYDRAMINE HCL 25 MG PO CAPS: 25.0000 mg | ORAL_CAPSULE | Freq: Four times a day (QID) | ORAL | Status: DC | PRN

## 2022-02-01 MED ORDER — OXYTOCIN-SODIUM CHLORIDE 30-0.9 UT/500ML-% IV SOLN
INTRAVENOUS | Status: DC | PRN
  Administered 2022-02-01: 300 mL via INTRAVENOUS

## 2022-02-01 MED ORDER — LACTATED RINGERS IV SOLN: INTRAVENOUS | Status: DC

## 2022-02-01 MED ORDER — SODIUM CHLORIDE 0.9 % IR SOLN
Status: DC | PRN
  Administered 2022-02-01: 1

## 2022-02-01 MED ORDER — BUPIVACAINE HCL (PF) 0.5 % IJ SOLN
INTRAMUSCULAR | Status: AC
  Filled 2022-02-01: qty 30

## 2022-02-01 MED ORDER — HYDROMORPHONE HCL 1 MG/ML IJ SOLN: 0.2500 mg | INTRAMUSCULAR | Status: DC | PRN

## 2022-02-01 MED ORDER — MEPERIDINE HCL 25 MG/ML IJ SOLN: 6.2500 mg | INTRAMUSCULAR | Status: DC | PRN

## 2022-02-01 MED ORDER — DIPHENHYDRAMINE HCL 25 MG PO CAPS: 25.0000 mg | ORAL_CAPSULE | ORAL | Status: DC | PRN

## 2022-02-01 MED ORDER — COCONUT OIL OIL: 1.0000 "application " | TOPICAL_OIL | Status: DC | PRN

## 2022-02-01 MED ORDER — ACETAMINOPHEN 500 MG PO TABS
1000.0000 mg | ORAL_TABLET | Freq: Once | ORAL | Status: AC
  Administered 2022-02-01: 1000 mg via ORAL

## 2022-02-01 MED ORDER — PHENYLEPHRINE HCL-NACL 20-0.9 MG/250ML-% IV SOLN
INTRAVENOUS | Status: DC | PRN
  Administered 2022-02-01: 60 ug/min via INTRAVENOUS

## 2022-02-01 MED ORDER — POVIDONE-IODINE 10 % EX SWAB
2.0000 "application " | Freq: Once | CUTANEOUS | Status: AC
  Administered 2022-02-01: 2 via TOPICAL

## 2022-02-01 MED ORDER — DEXMEDETOMIDINE (PRECEDEX) IN NS 20 MCG/5ML (4 MCG/ML) IV SYRINGE
PREFILLED_SYRINGE | INTRAVENOUS | Status: DC | PRN
  Administered 2022-02-01: 8 ug via INTRAVENOUS

## 2022-02-01 MED ORDER — BUPIVACAINE HCL (PF) 0.5 % IJ SOLN
INTRAMUSCULAR | Status: DC | PRN
  Administered 2022-02-01: 20 mL

## 2022-02-01 MED ORDER — DEXMEDETOMIDINE (PRECEDEX) IN NS 20 MCG/5ML (4 MCG/ML) IV SYRINGE
PREFILLED_SYRINGE | INTRAVENOUS | Status: AC
  Filled 2022-02-01: qty 5

## 2022-02-01 MED ORDER — OXYTOCIN-SODIUM CHLORIDE 30-0.9 UT/500ML-% IV SOLN
2.5000 [IU]/h | INTRAVENOUS | Status: AC
  Administered 2022-02-01: 2.5 [IU]/h via INTRAVENOUS
  Filled 2022-02-01: qty 500

## 2022-02-01 MED ORDER — SCOPOLAMINE 1 MG/3DAYS TD PT72
1.0000 | MEDICATED_PATCH | TRANSDERMAL | Status: DC
  Administered 2022-02-01: 1.5 mg via TRANSDERMAL

## 2022-02-01 MED ORDER — SCOPOLAMINE 1 MG/3DAYS TD PT72
MEDICATED_PATCH | TRANSDERMAL | Status: AC
  Filled 2022-02-01: qty 1

## 2022-02-01 MED ORDER — PRENATAL MULTIVITAMIN CH
1.0000 | ORAL_TABLET | Freq: Every day | ORAL | Status: DC
  Administered 2022-02-02 – 2022-02-03 (×2): 1 via ORAL
  Filled 2022-02-01 (×2): qty 1

## 2022-02-01 MED ORDER — DEXAMETHASONE SODIUM PHOSPHATE 10 MG/ML IJ SOLN
INTRAMUSCULAR | Status: DC | PRN
  Administered 2022-02-01: 4 mg via INTRAVENOUS

## 2022-02-01 MED ORDER — PROMETHAZINE HCL 25 MG/ML IJ SOLN: 6.2500 mg | INTRAMUSCULAR | Status: DC | PRN

## 2022-02-01 MED ORDER — KETOROLAC TROMETHAMINE 15 MG/ML IJ SOLN
INTRAMUSCULAR | Status: DC | PRN
  Administered 2022-02-01: 15 mg via INTRAVENOUS

## 2022-02-01 MED ORDER — FENTANYL CITRATE (PF) 100 MCG/2ML IJ SOLN
INTRAMUSCULAR | Status: DC | PRN
  Administered 2022-02-01: 15 ug via INTRATHECAL

## 2022-02-01 MED ORDER — ONDANSETRON HCL 4 MG/2ML IJ SOLN
4.0000 mg | Freq: Three times a day (TID) | INTRAMUSCULAR | Status: DC | PRN
  Administered 2022-02-01: 4 mg via INTRAVENOUS
  Filled 2022-02-01: qty 2

## 2022-02-01 MED ORDER — SOD CITRATE-CITRIC ACID 500-334 MG/5ML PO SOLN
ORAL | Status: AC
  Filled 2022-02-01: qty 30

## 2022-02-01 MED ORDER — MENTHOL 3 MG MT LOZG: 1.0000 | LOZENGE | OROMUCOSAL | Status: DC | PRN

## 2022-02-01 MED ORDER — ONDANSETRON HCL 4 MG/2ML IJ SOLN
INTRAMUSCULAR | Status: DC | PRN
  Administered 2022-02-01: 4 mg via INTRAVENOUS

## 2022-02-01 MED ORDER — STERILE WATER FOR IRRIGATION IR SOLN
Status: DC | PRN
  Administered 2022-02-01: 1000 mL

## 2022-02-01 MED ORDER — SIMETHICONE 80 MG PO CHEW
80.0000 mg | CHEWABLE_TABLET | Freq: Three times a day (TID) | ORAL | Status: DC
  Administered 2022-02-02 – 2022-02-03 (×4): 80 mg via ORAL
  Filled 2022-02-01 (×5): qty 1

## 2022-02-01 MED ORDER — CEFAZOLIN SODIUM-DEXTROSE 2-4 GM/100ML-% IV SOLN
2.0000 g | INTRAVENOUS | Status: AC
  Administered 2022-02-01: 2 g via INTRAVENOUS

## 2022-02-01 MED ORDER — WITCH HAZEL-GLYCERIN EX PADS: 1.0000 "application " | MEDICATED_PAD | CUTANEOUS | Status: DC | PRN

## 2022-02-01 MED ORDER — FENTANYL CITRATE (PF) 100 MCG/2ML IJ SOLN
INTRAMUSCULAR | Status: AC
  Filled 2022-02-01: qty 2

## 2022-02-01 MED ORDER — SENNOSIDES-DOCUSATE SODIUM 8.6-50 MG PO TABS
2.0000 | ORAL_TABLET | Freq: Every day | ORAL | Status: DC
  Administered 2022-02-02 – 2022-02-03 (×2): 2 via ORAL
  Filled 2022-02-01 (×3): qty 2

## 2022-02-01 MED ORDER — BUPIVACAINE IN DEXTROSE 0.75-8.25 % IT SOLN
INTRATHECAL | Status: DC | PRN
  Administered 2022-02-01: 1.5 mg via INTRATHECAL

## 2022-02-01 MED ORDER — CEFAZOLIN SODIUM-DEXTROSE 2-4 GM/100ML-% IV SOLN
INTRAVENOUS | Status: AC
  Filled 2022-02-01: qty 100

## 2022-02-01 MED ORDER — SODIUM CHLORIDE 0.9% FLUSH: 3.0000 mL | INTRAVENOUS | Status: DC | PRN

## 2022-02-01 SURGICAL SUPPLY — 33 items
BARRIER ADHS 3X4 INTERCEED (GAUZE/BANDAGES/DRESSINGS) IMPLANT
BENZOIN TINCTURE PRP APPL 2/3 (GAUZE/BANDAGES/DRESSINGS) ×1 IMPLANT
CHLORAPREP W/TINT 26ML (MISCELLANEOUS) ×3 IMPLANT
CLAMP CORD UMBIL (MISCELLANEOUS) ×1 IMPLANT
CLIP FILSHIE TUBAL LIGA STRL (Clip) ×2 IMPLANT
CLOSURE STERI STRIP 1/2 X4 (GAUZE/BANDAGES/DRESSINGS) ×1 IMPLANT
CLOTH BEACON ORANGE TIMEOUT ST (SAFETY) ×2 IMPLANT
DRSG OPSITE POSTOP 4X10 (GAUZE/BANDAGES/DRESSINGS) ×2 IMPLANT
ELECT REM PT RETURN 9FT ADLT (ELECTROSURGICAL) ×2
ELECTRODE REM PT RTRN 9FT ADLT (ELECTROSURGICAL) ×1 IMPLANT
EXTRACTOR VACUUM KIWI (MISCELLANEOUS) IMPLANT
GLOVE BIO SURGEON STRL SZ 6.5 (GLOVE) ×2 IMPLANT
GLOVE BIOGEL PI IND STRL 7.0 (GLOVE) ×2 IMPLANT
GLOVE BIOGEL PI INDICATOR 7.0 (GLOVE) ×2
GOWN STRL REUS W/TWL LRG LVL3 (GOWN DISPOSABLE) ×4 IMPLANT
KIT ABG SYR 3ML LUER SLIP (SYRINGE) IMPLANT
NDL HYPO 25X5/8 SAFETYGLIDE (NEEDLE) IMPLANT
NEEDLE HYPO 22GX1.5 SAFETY (NEEDLE) ×1 IMPLANT
NEEDLE HYPO 25X5/8 SAFETYGLIDE (NEEDLE) ×2 IMPLANT
NS IRRIG 1000ML POUR BTL (IV SOLUTION) ×2 IMPLANT
PACK C SECTION WH (CUSTOM PROCEDURE TRAY) ×2 IMPLANT
PAD OB MATERNITY 4.3X12.25 (PERSONAL CARE ITEMS) ×2 IMPLANT
RETRACTOR WND ALEXIS 25 LRG (MISCELLANEOUS) IMPLANT
RTRCTR WOUND ALEXIS 25CM LRG (MISCELLANEOUS) ×2
SUT VIC AB 0 CT1 36 (SUTURE) ×12 IMPLANT
SUT VIC AB 2-0 CT1 27 (SUTURE) ×1
SUT VIC AB 2-0 CT1 TAPERPNT 27 (SUTURE) ×1 IMPLANT
SUT VIC AB 4-0 KS 27 (SUTURE) ×1 IMPLANT
SUT VIC AB 4-0 PS2 27 (SUTURE) ×2 IMPLANT
SYR CONTROL 10ML LL (SYRINGE) ×1 IMPLANT
TOWEL OR 17X24 6PK STRL BLUE (TOWEL DISPOSABLE) ×2 IMPLANT
TRAY FOLEY W/BAG SLVR 14FR LF (SET/KITS/TRAYS/PACK) IMPLANT
WATER STERILE IRR 1000ML POUR (IV SOLUTION) ×2 IMPLANT

## 2022-02-01 NOTE — Progress Notes (Signed)
Call placed to Dr Debroah Loop to clarify post partum orders for nausea and pain, order received to give additional Zofran now.  ?

## 2022-02-01 NOTE — Anesthesia Procedure Notes (Addendum)
Spinal ? ?Patient location during procedure: OR ?Start time: 02/01/2022 9:12 AM ?End time: 02/01/2022 9:15 AM ?Reason for block: surgical anesthesia ?Staffing ?Performed: anesthesiologist  ?Anesthesiologist: Nolon Nations, MD ?Preanesthetic Checklist ?Completed: patient identified, IV checked, site marked, risks and benefits discussed, surgical consent, monitors and equipment checked, pre-op evaluation and timeout performed ?Spinal Block ?Prep: DuraPrep and site prepped and draped ?Patient monitoring: heart rate, continuous pulse ox and blood pressure ?Approach: midline ?Location: L2-3 ?Injection technique: single-shot ?Needle ?Needle type: Spinocan  ?Needle gauge: 25 G ?Needle length: 9 cm ?Additional Notes ?Expiration date of kit checked and confirmed. Patient tolerated procedure well, without complications. ? ? ? ?

## 2022-02-01 NOTE — H&P (Signed)
Helen Powell is a 31 y.o. female presenting for repeat cesarean section and bilateral tubal ligation. ?OB History   ? ? Gravida  ?6  ? Para  ?3  ? Term  ?3  ? Preterm  ?   ? AB  ?2  ? Living  ?3  ?  ? ? SAB  ?1  ? IAB  ?   ? Ectopic  ?   ? Multiple  ?   ? Live Births  ?3  ?   ?  ?  ? ?Past Medical History:  ?Diagnosis Date  ? Medical history non-contributory   ? ?Past Surgical History:  ?Procedure Laterality Date  ? CESAREAN SECTION    ? ?Family History: family history includes Cancer in her mother; Diabetes in her mother; Hypertension in her mother. ?Social History:  reports that she has never smoked. She has never been exposed to tobacco smoke. She has never used smokeless tobacco. She reports that she does not currently use drugs after having used the following drugs: Marijuana. She reports that she does not drink alcohol. ? ? ?  ?Maternal Diabetes: No ?Genetic Screening: Declined ?Maternal Ultrasounds/Referrals: IUGR ?Fetal Ultrasounds or other Referrals:  None ?Maternal Substance Abuse:  Yes:  Type: Marijuana ?Significant Maternal Medications:  None ?Significant Maternal Lab Results:  Group B Strep negative ?Other Comments:  None ? ?Review of Systems ?Maternal Medical History:  ?Reason for admission: Elective repeat cesarean section, [redacted]w[redacted]d ? ? ?Fetal activity: Perceived fetal activity is normal.   ?Prenatal complications: IUGR.   ?Prenatal Complications - Diabetes: none. ? ?  ?Blood pressure 112/80, pulse 92, temperature 98.2 ?F (36.8 ?C), temperature source Oral, resp. rate 18, last menstrual period 05/12/2021. ?Maternal Exam:  ?Abdomen: Patient reports no abdominal tenderness. Surgical scars: low transverse.   ?Introitus: not evaluated.   ? ?Physical Exam ?Vitals and nursing note reviewed.  ?Constitutional:   ?   Appearance: Normal appearance.  ?HENT:  ?   Head: Normocephalic and atraumatic.  ?Eyes:  ?   Pupils: Pupils are equal, round, and reactive to light.  ?Cardiovascular:  ?   Rate and Rhythm: Normal  rate.  ?Pulmonary:  ?   Effort: Pulmonary effort is normal.  ?Abdominal:  ?   Comments: gravid  ?Musculoskeletal:  ?   Cervical back: Normal range of motion.  ?Neurological:  ?   General: No focal deficit present.  ?   Mental Status: She is alert.  ?Psychiatric:     ?   Mood and Affect: Mood normal.     ?   Behavior: Behavior normal.  ?  ?Prenatal labs: ?ABO, Rh: --/--/O POS (03/11 2671) ?Antibody: NEG (03/11 2458) ?Rubella: 1.94 (12/27 1450) ?RPR: Non Reactive (12/27 1450)  ?HBsAg: Negative (12/27 1450)  ?HIV: Non Reactive (12/27 1450)  ?GBS: Negative/-- (02/24 0998)  ? ?Assessment/Plan: ?Supervision of other normal pregnancy, antepartum ? ?Late prenatal care affecting pregnancy, antepartum ? ?Marijuana use during pregnancy ? ?Delivery of second pregnancy by cesarean section using transverse incision of lower segment of uterus ? ?Delivery of first pregnancy by cesarean section using transverse incision of lower segment of uterus ? ?Delivery of third pregnancy by cesarean section using transverse incision of lower segment of uterus - Plan: Type and screen [facility], CBC, Type and screen [facility], CBC ? ?Iron deficiency anemia during pregnancy ?The risks of surgery were discussed with the patient including but were not limited to: bleeding which may require transfusion or reoperation; infection which may require  antibiotics; injury to bowel, bladder, ureters or other surrounding organs; injury to the fetus; need for additional procedures including hysterectomy in the event of a life-threatening hemorrhage; formation of adhesions; placental abnormalities with subsequent pregnancies; incisional problems; thromboembolic phenomenon and other postoperative/anesthesia complications.  The patient concurred with the proposed plan, giving informed written consent for the procedure.   Patient has been NPO since 0000 she will remain NPO for procedure. Anesthesia and OR aware. Preoperative  prophylactic antibiotics and SCDs ordered on call to the OR.  To OR when ready. ?  31 y.o. [id number] with undesired fertility,desires permanent sterilization. Risks and benefits of procedure discussed with patient including permanence of method, Filshie clips, bleeding, infection, injury to surrounding organs and need for additional procedures. Risk failure of 0.5-1% with increased risk of ectopic gestation if pregnancy occurs was also discussed with patient. Patient verbalized understanding and all questions were answered. ? ? ?    ? ?Scheryl Darter ?02/01/2022, 8:34 AM ? ? ? ? ?

## 2022-02-01 NOTE — Discharge Summary (Signed)
? ?  Postpartum Discharge Summary ? ?   ?Patient Name: Helen Powell ?DOB: Oct 06, 1991 ?MRN: [id number] ? ?Date of admission: 02/01/2022 ?Delivery date:02/01/2022  ?Delivering provider: Patriciaann Clan  ?Date of discharge: 02/03/2022 ? ?Admitting diagnosis: Previous cesarean section [Z98.891] ?Intrauterine pregnancy: [redacted]w[redacted]d    ?Secondary diagnosis:  Principal Problem: ?  Previous cesarean section ?Active Problems: ?  Supervision of other normal pregnancy, antepartum ?  Marijuana use during pregnancy ?  Iron deficiency anemia during pregnancy ?  Fetal growth restriction antepartum ?  Unwanted fertility ? ?Additional problems: none    ?Discharge diagnosis: Term Pregnancy Delivered                                              ?Post partum procedures: none ?Augmentation: N/A ?Complications: None ? ?Hospital course: Sceduled C/S   31y.o. yo [id number] admitted to the hospital 02/01/2022 for scheduled cesarean section with the following indication:Elective Repeat.Delivery details are as follows:  ?Membrane Rupture Time/Date: 9:44 AM ,02/01/2022   ?Delivery Method:C-Section, Low Transverse  ?Details of operation can be found in separate operative note.  Patient had an uncomplicated postpartum course.  She is ambulating, tolerating a regular diet, passing flatus, and urinating well. Patient is discharged home in stable condition on  02/03/22 ?       ?Newborn Data: ?Birth date:02/01/2022  ?Birth time:9:44 AM  ?Gender:Female  ?Living status:Living  ?Apgars:8 ,8  ?Weight:2730 g    ? ?Magnesium Sulfate received: No ?BMZ received: No ?Rhophylac:No ?MMR:No ?T-DaP:Given prenatally ?Flu: No ?Transfusion:No ? ?Physical exam  ?Vitals:  ? 02/02/22 0432 02/02/22 1730 02/02/22 2225 02/03/22 0515  ?BP: (!) 107/55 (!) 114/57 108/66 95/64  ?Pulse: 66 (!) 57 63 (!) 53  ?Resp: _0 ?Temp: 98.4 ?F (36.9 ?C) 98.4 ?F (36.9 ?C)  98.4 ?F (36.9 ?C)  ?TempSrc: Oral Oral  Oral  ?SpO2:    100%  ? ?General: alert, cooperative, and no  distress ?Lochia: appropriate ?Uterine Fundus: firm ?Incision: Healing well with no significant drainage ?DVT Evaluation: No evidence of DVT seen on physical exam. ?Negative Homan's sign. ?Labs: ?Lab Results  ?Component Value Date  ? WBC 14.4 (H) 02/02/2022  ? HGB 7.5 (L) 02/02/2022  ? HCT 23.4 (L) 02/02/2022  ? MCV 74.5 (L) 02/02/2022  ? PLT 177 02/02/2022  ? ?CMP Latest Ref Rng & Units 10/25/2020  ?Glucose 70 - 99 mg/dL 93  ?BUN 6 - 20 mg/dL 11  ?Creatinine 0.44 - 1.00 mg/dL 0.73  ?Sodium 135 - 145 mmol/L 138  ?Potassium 3.5 - 5.1 mmol/L 3.4(L)  ?Chloride 98 - 111 mmol/L 103  ?CO2 22 - 32 mmol/L 25  ?Calcium 8.9 - 10.3 mg/dL 9.6  ? ?Edinburgh Score: ?Edinburgh Postnatal Depression Scale Screening Tool 02/03/2022  ?I have been able to laugh and see the funny side of things. (No Data)  ? ? ? ?After visit meds:  ?Allergies as of 02/03/2022   ?No Known Allergies ?  ? ?  ?Medication List  ?  ? ?STOP taking these medications   ? ?ascorbic acid 500 MG tablet ?Commonly known as: VITAMIN C ?  ?ferrous sulfate 325 (65 FE) MG tablet ?Commonly known as: FerrouSul ?  ? ?  ? ?TAKE these medications   ? ?ibuprofen 600 MG tablet ?Commonly known as: ADVIL ?Take 1 tablet (600 mg total) by mouth  every 6 (six) hours. ?  ?oxyCODONE 5 MG immediate release tablet ?Commonly known as: Oxy IR/ROXICODONE ?Take 1 tablet (5 mg total) by mouth every 4 (four) hours as needed for moderate pain. ?  ?Prenatal Plus Vitamin/Mineral 27-1 MG Tabs ?Take 1 tablet by mouth daily at 6 (six) AM. ?  ? ?  ? ? ? ?Discharge home in stable condition ?Infant Feeding: Bottle ?Infant Disposition:home with mother ?Discharge instruction: per After Visit Summary and Postpartum booklet. ?Activity: Advance as tolerated. Pelvic rest for 6 weeks.  ?Diet: routine diet ?Future Appointments: ?Future Appointments  ?Date Time Provider [place]  ?02/10/2022  9:00 AM [facility] NURSE [facility]  ?03/17/2022  2:15 PM Chancy Milroy, MD [facility]  ? ?Follow up  Visit: ? ?Message sent to [facility] by Dr Higinio Plan:  ?Please schedule this patient for a In person postpartum visit in 6 weeks with the following provider: Any provider. ?Additional Postpartum F/U:Incision check 1 week  ?High risk pregnancy complicated by:  FGR, THC use ?Delivery mode:  C-Section, Low Transverse  ?Anticipated Birth Control:  BTL done PP ? ? ?02/03/2022 ?Truett Mainland, DO ? ? ? ?

## 2022-02-01 NOTE — Lactation Note (Signed)
This note was copied from a baby's chart. ?Lactation Consultation Note ?Mom chooses to formula feed. ? ?Patient Name: Helen Powell ?Today's Date: 02/01/2022 ?  ?Age:29 hours ? ?Maternal Data ?  ? ?Feeding ?Nipple Type: Slow - flow ? ?LATCH Score ?  ? ?  ? ?  ? ?  ? ?  ? ?  ? ? ?Lactation Tools Discussed/Used ?  ? ?Interventions ?  ? ?Discharge ?  ? ?Consult Status ?Consult Status: Complete ? ? ? ?Charyl Dancer ?02/01/2022, 7:37 PM ? ? ? ?

## 2022-02-01 NOTE — Anesthesia Preprocedure Evaluation (Signed)
Anesthesia Evaluation  ?Patient identified by MRN, date of birth, ID band ?Patient awake ? ? ? ?Reviewed: ?Allergy & Precautions, Patient's Chart, lab work & pertinent test results ? ?Airway ?Mallampati: II ? ?TM Distance: >3 FB ?Neck ROM: Full ? ? ? Dental ?no notable dental hx. ?(+) Dental Advisory Given ?  ?Pulmonary ?neg pulmonary ROS,  ?  ?Pulmonary exam normal ?breath sounds clear to auscultation ? ? ? ? ? ? Cardiovascular ?negative cardio ROS ?Normal cardiovascular exam ?Rhythm:Regular Rate:Normal ? ? ?  ?Neuro/Psych ?negative neurological ROS ?   ? GI/Hepatic ?negative GI ROS, Neg liver ROS,   ?Endo/Other  ?negative endocrine ROS ? Renal/GU ?negative Renal ROS  ? ?  ?Musculoskeletal ?negative musculoskeletal ROS ?(+)  ? Abdominal ?  ?Peds ? Hematology ? ?(+) Blood dyscrasia, anemia ,   ?Anesthesia Other Findings ? ? Reproductive/Obstetrics ?(+) Pregnancy ? ?  ? ? ? ? ? ? ? ? ? ? ? ? ? ?  ?  ? ? ? ? ? ? ? ? ?Anesthesia Physical ?Anesthesia Plan ? ?ASA: 2 ? ?Anesthesia Plan: Spinal  ? ?Post-op Pain Management: Toradol IV (intra-op)* and Tylenol PO (pre-op)*  ? ?Induction: Intravenous ? ?PONV Risk Score and Plan: 4 or greater and Ondansetron, Dexamethasone, Scopolamine patch - Pre-op and Treatment may vary due to age or medical condition ? ?Airway Management Planned:  ? ?Additional Equipment:  ? ?Intra-op Plan:  ? ?Post-operative Plan:  ? ?Informed Consent: I have reviewed the patients History and Physical, chart, labs and discussed the procedure including the risks, benefits and alternatives for the proposed anesthesia with the patient or authorized representative who has indicated his/her understanding and acceptance.  ? ? ? ?Dental advisory given ? ?Plan Discussed with: CRNA ? ?Anesthesia Plan Comments:   ? ? ? ? ? ? ?Anesthesia Quick Evaluation ? ?

## 2022-02-01 NOTE — Transfer of Care (Signed)
Immediate Anesthesia Transfer of Care Note ? ?Patient: Helen Powell ? ?Procedure(s) Performed: CESAREAN SECTION ? ?Patient Location: PACU ? ?Anesthesia Type:Regional ? ?Level of Consciousness: awake ? ?Airway & Oxygen Therapy: Patient Spontanous Breathing ? ?Post-op Assessment: Report given to RN ? ?Post vital signs: Reviewed and stable ? ?Last Vitals:  ?Vitals Value Taken Time  ?BP 103/68 02/01/22 1037  ?Temp    ?Pulse 60 02/01/22 1038  ?Resp 22 02/01/22 1038  ?SpO2 100 % 02/01/22 1038  ?Vitals shown include unvalidated device data. ? ?Last Pain:  ?Vitals:  ? 02/01/22 0648  ?TempSrc: Oral  ?   ? ?  ? ?Complications: No notable events documented. ?

## 2022-02-01 NOTE — Anesthesia Postprocedure Evaluation (Signed)
Anesthesia Post Note ? ?Patient: Helen Powell ? ?Procedure(s) Performed: CESAREAN SECTION ? ?  ? ?Patient location during evaluation: PACU ?Anesthesia Type: Spinal ?Level of consciousness: awake and alert ?Pain management: pain level controlled ?Vital Signs Assessment: post-procedure vital signs reviewed and stable ?Respiratory status: spontaneous breathing and respiratory function stable ?Cardiovascular status: blood pressure returned to baseline and stable ?Postop Assessment: spinal receding ?Anesthetic complications: no ? ? ?No notable events documented. ? ?Last Vitals:  ?Vitals:  ? 02/01/22 1402 02/01/22 1500  ?BP: 103/64 106/60  ?Pulse: (!) 54 66  ?Resp: 16 18  ?Temp: 36.8 ?C 36.7 ?C  ?SpO2: 98% 99%  ?  ?Last Pain:  ?Vitals:  ? 02/01/22 1402  ?TempSrc: Oral  ?PainSc:   ? ? ?  ?  ?  ?  ?  ?  ? ?[name] ? ? ? ? ?

## 2022-02-01 NOTE — Op Note (Signed)
Helen Powell ?PROCEDURE DATE: 02/01/2022 ? ?PREOPERATIVE DIAGNOSIS: Intrauterine pregnancy at  [redacted]w[redacted]d weeks gestation;  previous uterine incision, fetal growth restriction (AC 3%) , undesired fertility  ? ?POSTOPERATIVE DIAGNOSIS: The same ? ?PROCEDURE: Repeat Low Transverse Cesarean Section, Bilateral Tubal Ligation with Filshie Clips   ? ?SURGEON:  Dr. Emeterio Reeve  ? ?ASSISTANT: Dr. Darrelyn Hillock ? ?INDICATIONS: Helen Powell is a 31 y.o. [id number] at [redacted]w[redacted]d scheduled for cesarean section secondary to  previous uterine incision, FGR (AC 3%) .  The risks of cesarean section discussed with the patient included but were not limited to: bleeding which may require transfusion or reoperation; infection which may require antibiotics; injury to bowel, bladder, ureters or other surrounding organs; injury to the fetus; need for additional procedures including hysterectomy in the event of a life-threatening hemorrhage; placental abnormalities wth subsequent pregnancies, incisional problems, thromboembolic phenomenon and other postoperative/anesthesia complications. The patient concurred with the proposed plan, giving informed written consent for the procedure.   ? ?FINDINGS:  Viable female infant in cephalic presentation.  Apgars 8 and 8, weight, 2730g.  Clear amniotic fluid.  Intact placenta, three vessel cord.  Normal uterus, fallopian tubes and ovaries bilaterally. Mild adhesive disease. Thin lower uterine segment.  ? ?ANESTHESIA:    Spinal ?INTRAVENOUS FLUIDS: 2300 ml ?ESTIMATED BLOOD LOSS: 145 ml ?URINE OUTPUT:  800 ml ?SPECIMENS: Placenta sent to L&D ?COMPLICATIONS: None immediate ? ?PROCEDURE IN DETAIL:  The patient received intravenous antibiotics and had sequential compression devices applied to her lower extremities while in the preoperative area.  She was then taken to the operating room where spinal anesthesia was administered and was found to be adequate. She was then placed in a dorsal supine position with a  leftward tilt, and prepped and draped in a sterile manner.  A foley catheter was placed into her bladder and attached to constant gravity, which drained clear fluid throughout.  After an adequate timeout was performed, a Pfannenstiel skin incision was made with scalpel and carried through to the underlying layer of fascia. The fascia was incised in the midline and this incision was extended bilaterally using the Mayo scissors. Kocher clamps were applied to the superior aspect of the fascial incision and the underlying rectus muscles were dissected off bluntly and sharply. A similar process was carried out on the inferior aspect of the facial incision. The rectus muscles were separated in the midline bluntly and the peritoneum was entered with [employer]. An Alexis retractor was placed to aid in visualization of the uterus.  Attention was turned to the lower uterine segment where a transverse hysterotomy was made with a scalpel and extended bilaterally bluntly. The infant was successfully delivered, and cord was clamped and cut and infant was handed over to awaiting neonatology team. Uterine massage was then administered and the placenta delivered intact with three-vessel cord. The uterus was then cleared of clot and debris.  The hysterotomy was closed with 0 Vicryl in a running locked fashion, and an imbricating layer was also placed with a 0 Vicryl. Overall, excellent hemostasis was noted. ? ?Attention was then turned to the patient's left fallopian tube was identified and followed out to the fimbriated end.  A Filshie clip was placed on the left fallopian tube about 2 cm from the cornual attachment, with care given to incorporate the underlying mesosalpinx.  A similar process was carried out on the right side allowing for bilateral tubal sterilization. ? ? The abdomen and the pelvis were cleared of all clot and debris  and the Ubaldo Glassing was removed. Hemostasis was confirmed on all surfaces.  The peritoneum was  reapproximated using 2-0 vicryl running stitches. The fascia was then closed using 0 Vicryl in a running fashion. The skin was closed with 4-0 vicryl. The patient tolerated the procedure well. Sponge, lap, instrument and needle counts were correct x 2. She was taken to the recovery room in stable condition.  ? ? ?Patriciaann Clan, DO ?02/01/2022 10:33 AM  ?

## 2022-02-02 ENCOUNTER — Encounter (HOSPITAL_COMMUNITY): Payer: Self-pay | Admitting: Obstetrics & Gynecology

## 2022-02-02 LAB — CBC
HCT: 23.4 % — ABNORMAL LOW (ref 36.0–46.0)
Hemoglobin: 7.5 g/dL — ABNORMAL LOW (ref 12.0–15.0)
MCH: 23.9 pg — ABNORMAL LOW (ref 26.0–34.0)
MCHC: 32.1 g/dL (ref 30.0–36.0)
MCV: 74.5 fL — ABNORMAL LOW (ref 80.0–100.0)
Platelets: 177 10*3/uL (ref 150–400)
RBC: 3.14 MIL/uL — ABNORMAL LOW (ref 3.87–5.11)
RDW: 15.4 % (ref 11.5–15.5)
WBC: 14.4 10*3/uL — ABNORMAL HIGH (ref 4.0–10.5)
nRBC: 0 % (ref 0.0–0.2)

## 2022-02-02 MED ORDER — SODIUM CHLORIDE 0.9 % IV SOLN
500.0000 mg | Freq: Once | INTRAVENOUS | Status: AC
  Administered 2022-02-02: 500 mg via INTRAVENOUS
  Filled 2022-02-02: qty 25

## 2022-02-02 NOTE — Progress Notes (Signed)
Circumcision Consent ° °Discussed with mom at bedside about circumcision.  ° °Circumcision is a surgery that removes the skin that covers the tip of the penis, called the "foreskin." Circumcision is usually done when a boy is between 1 and 10 days old, sometimes up to 3-4 weeks old. ° °The most common reasons boys are circumcised include for cultural/religious beliefs or for parental preference (potentially easier to clean, so baby looks like daddy, etc). ° °There may be some medical benefits for circumcision:  ° °Circumcised boys seem to have slightly lower rates of: °? Urinary tract infections (per the American Academy of Pediatrics an uncircumcised boy has a 1/100 chance of developing a UTI in the first year of life, a circumcised boy at a 11/998 chance of developing a UTI in the first year of life- a 10% reduction) °? Penis cancer (typically rare- an uncircumcised female has a 1 in 100,000 chance of developing cancer of the penis) °? Sexually transmitted infection (in endemic areas, including HIV, HPV and Herpes- circumcision does NOT protect against gonorrhea, chlamydia, trachomatis, or syphilis) °? Phimosis: a condition where that makes retraction of the foreskin over the glans impossible (0.4 per 1000 boys per year or 0.6% of boys are affected by their 15th birthday) ° °Boys and men who are not circumcised can reduce these extra risks by: °? Cleaning their penis well °? Using condoms during sex ° °What are the risks of circumcision? ° °As with any surgical procedure, there are risks and complications. In circumcision, complications are rare and usually minor, the most common being: °? Bleeding- risk is reduced by holding each clamp for 30 seconds prior to a cut being made, and by holding pressure after the procedure is done °? Infection- the penis is cleaned prior to the procedure, and the procedure is done under sterile technique °? Damage to the urethra or amputation of the penis ° °How is circumcision done  in baby boys? ° °The baby will be placed on a special table and the legs restrained for their safety. Numbing medication is injected into the penis, and the skin is cleansed with betadine to decrease the risk of infection.  ° °What to expect: ° °The penis will look red and raw for 5-7 days as it heals. We expect scabbing around where the cut was made, as well as clear-pink fluid and some swelling of the penis right after the procedure. °If your baby's circumcision starts to bleed or develops pus, please contact your pediatrician immediately. ° °All questions were answered and mother consented. ° °Helen Powell Helen Powell Helen Powell °  °

## 2022-02-02 NOTE — Progress Notes (Signed)
POSTPARTUM PROGRESS NOTE ? ?POD #1 ? ?Subjective: ? ?Yona Tomey is a 31 y.o. [id number] s/p rLTCS at [redacted]w[redacted]d. No acute events overnight. She reports she is doing well. She denies any problems with ambulating, voiding or po intake. Denies nausea or vomiting. She has passed flatus. Pain is well controlled.  Lochia is mild. ? ?Objective: ?Blood pressure (!) 107/55, pulse 66, temperature 98.4 ?F (36.9 ?C), temperature source Oral, resp. rate 18, last menstrual period 05/12/2021, SpO2 99 %, unknown if currently breastfeeding. ? ?Physical Exam:  ?General: alert, cooperative and no distress ?Chest: no respiratory distress ?Heart: regular rate, distal pulses intact ?Uterine Fundus: firm, appropriately tender ?DVT Evaluation: No calf swelling or tenderness ?Extremities: no edema ?Skin: warm, dry; incision clean/dry/intact w/ small amount of saturation w/ honeycomb dressing in place ? ?Recent Labs  ?  02/01/22 ?0700 02/02/22 ?0431  ?HGB 9.1* 7.5*  ?HCT 30.0* 23.4*  ? ? ?Assessment/Plan: ?Kenlyn Lose is a 31 y.o. [id number] s/p rLTCS at [redacted]w[redacted]d for rLTCS. ? ?POD#1 - Doing welll; pain well controlled.  ? Routine postpartum care ? OOB, ambulated ? Lovenox for VTE prophylaxis ?Acute blood loss Anemia: asymptomatic ?  IV venofer.  ? ?Contraception: BTL done  ?Feeding: formula  ? ?Dispo: Plan for discharge tomorrow or the following day. ? ? LOS: 1 day  ? ?Leticia Penna, DO ?OB Fellow  ?02/02/2022, 7:58 AM  ?

## 2022-02-02 NOTE — Clinical Social Work Maternal (Signed)
?CLINICAL SOCIAL WORK MATERNAL/CHILD NOTE ? ?Patient Details  ?Name: Helen Powell ?MRN: [id number] ?Date of Birth: 06/04/1991 ? ?Date:  02/02/2022 ? ?Clinical Social Worker Initiating Note:  Rayshaun Needle, LCSW Date/Time: Initiated:  02/02/22/1210    ? ?Child's Name:  Helen Powell  ? ?Biological Parents:  Mother, Father (Father: Tyree Roberts 06/24/1992)  ? ?Need for Interpreter:  None  ? ?Reason for Referral:  Current Substance Use/Substance Use During Pregnancy  , Other (Comment) (Unsure if she has everything needed for baby   Medication assistance needed   Min interaction with baby   Lack of support from FOB/ ? FOB taking money from patient ? Custody of other children Untreated anxiety & depression)  ? ?Address:  [address] ?[place] [address]  ?  ?Phone number:  [phone] (home)    ? ?Additional phone number:  ? ?Household Members/Support Persons (HM/SP):   Household Member/Support Person 1, Household Member/Support Person 2 ? ? ?HM/SP Name Relationship DOB or Age  ?HM/SP -1   sister    ?HM/SP -2 Tyere Cantero son 12/01/17  ?HM/SP -3        ?HM/SP -4        ?HM/SP -5        ?HM/SP -6        ?HM/SP -7        ?HM/SP -8        ? ? ?Natural Supports (not living in the home):  Immediate Family  ? ?Professional Supports: None  ? ?Employment: Unemployed  ? ?Type of Work:    ? ?Education:  Some College  ? ?Homebound arranged:   ? ?Financial Resources:  Medicaid  ? ?Other Resources:  WIC, Food Stamps    ? ?Cultural/Religious Considerations Which May Impact Care:   ? ?Strengths:  Ability to meet basic needs  , Home prepared for child    ? ?Psychotropic Medications:        ? ?Pediatrician:      ? ?Pediatrician List: Provided Pediatrician List ? ?[place]    ?[place]    ?[place]    ?[place]    ?[place]    ?[place]    ? ? ?Pediatrician Fax Number:   ? ?Risk Factors/Current Problems:  Substance Use    ? ?Cognitive State:  Alert  , Able to Concentrate  , Linear Thinking   , Goal Oriented    ? ?Mood/Affect:  Calm  , Interested  , Comfortable    ? ?CSW Assessment: CSW met with MOB at bedside to complete psychosocial assessment, FOB present and asleep on the couch. MOB granted CSW verbal permission to speak in front of FOB about anything. CSW introduced self and explained reason for consult. MOB was sitting up in bed and holding infant. MOB was welcoming, open, and remained engaged during assessment. MOB reported that she resides with her sister and older son. MOB reported that her other children (Demarcus Caridi 08/02/12; resides with her niece Traneisha Brown) (Jamiya Willy 11/20/10; resides in [place] with daughter's dad Jacorey Lockett). MOB reported that CPS was not involved with her children not staying in the home with her. MOB reported that she is in communication with all of her children and reported that she plans on all of her children residing with her after this year. MOB reported that she receives both WIC and food stamps. MOB reported having all items needed to care for infant except for a basinet. MOB reported that she plans to purchase a   basinet at discharge. CSW explained that the hospital can provide a pack and play if needed. MOB reported that she has a car seat for infant. CSW inquired about any stressors for MOB, MOB reported no stressors and denied needing any resources. CSW inquired about MOB's need for medication assistance, MOB reported that she did not need any medication assistance. CSW inquired about MOB's support system, MOB reported that her sisters are supports.  ? ?CSW inquired about MOB's mental health history. MOB denied any mental health history. MOB denied any history of postpartum depression. CSW inquired about how MOB was feeling emotionally since giving birth, MOB reported that she was feeling good. MOB presented calm and did not demonstrate any acute mental health signs/symptoms. CSW assessed for safety, MOB denied SI and HI. CSW did not  assess for domestic violence as FOB was present. MOB reported that she loves infant and feels that she is attached and bonding with infant.  ? ?CSW provided education regarding the baby blues period vs. perinatal mood disorders, discussed treatment and gave resources for mental health follow up if concerns arise.  CSW recommends self-evaluation during the postpartum time period using the New Mom Checklist from Postpartum Progress and encouraged MOB to contact a medical professional if symptoms are noted at any time.   ? ?CSW provided review of Sudden Infant Death Syndrome (SIDS) precautions.   ? ?CSW informed MOB about the hospital drug screen policy as there is documented substance use during pregnancy. MOB confirmed marijuana use and reported that she last used marijuana 2-3 months ago. MOB denied any additional substance use during pregnancy. CSW informed MOB that infant's UDS was negative and CDS would continue to be monitored and a CPS report would be made if warranted. MOB verbalized understanding and denied any CPS history.  ? ?CSW asked if MOB was interested in parenting education community support referrals, MOB declined.  ? ?CSW followed up with MOB's RN. RN reported no concerns and reported that MOB is doing will with care for infant.  ? ?CSW identifies no further need for intervention and no barriers to discharge at this time. ? ? ?CSW Plan/Description:  No Further Intervention Required/No Barriers to Discharge, Sudden Infant Death Syndrome (SIDS) Education, Perinatal Mood and Anxiety Disorder (PMADs) Education, Other Patient/Family Education, [place], CSW Will Continue to Monitor Umbilical Cord Tissue Drug Screen Results and Make Report if Warranted  ? ? ?Burnis Medin, LCSW ?02/02/2022, 12:42 PM ? ?

## 2022-02-03 ENCOUNTER — Encounter (HOSPITAL_COMMUNITY): Payer: Self-pay | Admitting: Obstetrics & Gynecology

## 2022-02-03 ENCOUNTER — Other Ambulatory Visit (HOSPITAL_COMMUNITY): Payer: Self-pay

## 2022-02-03 MED ORDER — IBUPROFEN 600 MG PO TABS
600.0000 mg | ORAL_TABLET | Freq: Four times a day (QID) | ORAL | 0 refills | Status: AC
  Filled 2022-02-03: qty 30, 8d supply, fill #0

## 2022-02-03 MED ORDER — OXYCODONE HCL 5 MG PO TABS
5.0000 mg | ORAL_TABLET | ORAL | 0 refills | Status: AC | PRN
  Filled 2022-02-03: qty 20, 4d supply, fill #0

## 2022-02-05 ENCOUNTER — Other Ambulatory Visit: Payer: Self-pay

## 2022-02-05 DIAGNOSIS — Z3480 Encounter for supervision of other normal pregnancy, unspecified trimester: Secondary | ICD-10-CM

## 2022-02-05 NOTE — Progress Notes (Signed)
Renard Matter, MD  P [facility] Clinical Pool ?Hi  ?This patient did not get her admission RPR test when she was admitted for her c-section and peds needs that information. Can we have her do a lab visit for an RPR when she comes for her incision check on 3/20?  ? ?Thanks  ?Dr. Cy Blamer  ?

## 2022-02-06 ENCOUNTER — Encounter: Payer: Medicaid Other | Admitting: Obstetrics and Gynecology

## 2022-02-10 ENCOUNTER — Ambulatory Visit: Payer: Medicaid Other

## 2022-02-11 ENCOUNTER — Telehealth (HOSPITAL_COMMUNITY): Payer: Self-pay | Admitting: *Deleted

## 2022-02-11 NOTE — Telephone Encounter (Signed)
Attempted Hospital Discharge Follow-Up Call.  Left voice mail requesting that patient return RN's phone call.  

## 2022-03-17 ENCOUNTER — Ambulatory Visit: Payer: Medicaid Other | Admitting: Obstetrics and Gynecology

## 2022-04-17 ENCOUNTER — Ambulatory Visit: Payer: Medicaid Other | Admitting: Obstetrics and Gynecology

## 2022-04-29 ENCOUNTER — Encounter: Payer: Self-pay | Admitting: Certified Nurse Midwife

## 2022-04-30 ENCOUNTER — Ambulatory Visit (INDEPENDENT_AMBULATORY_CARE_PROVIDER_SITE_OTHER): Payer: Medicaid Other | Admitting: Certified Nurse Midwife

## 2022-04-30 DIAGNOSIS — Z98891 History of uterine scar from previous surgery: Secondary | ICD-10-CM

## 2022-04-30 NOTE — Progress Notes (Signed)
No show

## 2022-05-14 ENCOUNTER — Ambulatory Visit: Payer: Medicaid Other | Admitting: Obstetrics & Gynecology

## 2023-05-25 IMAGING — US US MFM OB COMP +14 WKS
1 series · 13 of 28 positions shown · non-contrast
Comparison: none

[Series 1: us mfm ob comp +14 wks · 109 acquisitions, 13 frames shown]
[im 5/109]
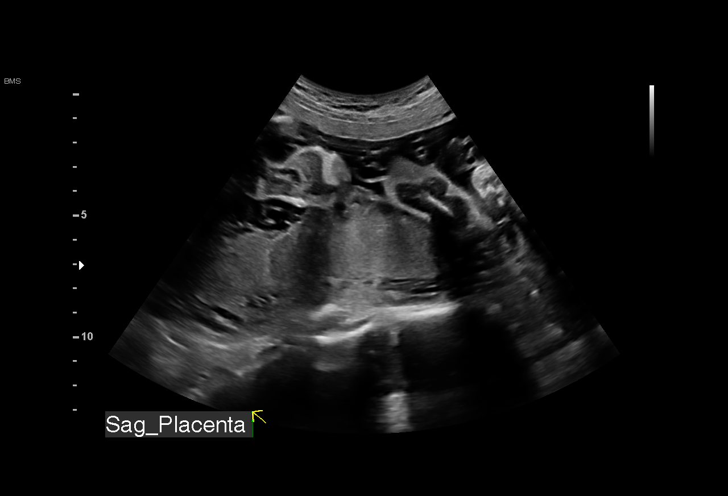
[im 13/109]
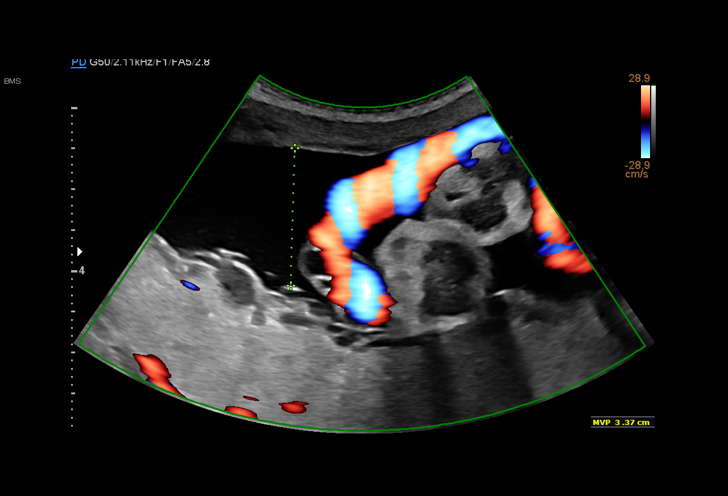
[im 21/109]
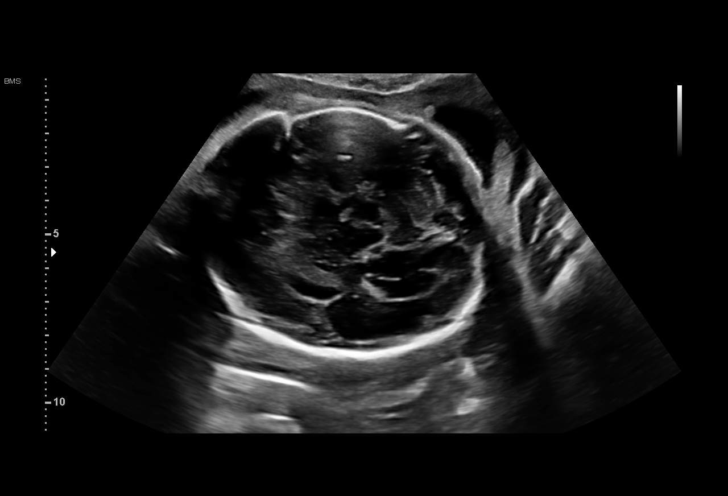
[im 29/109]
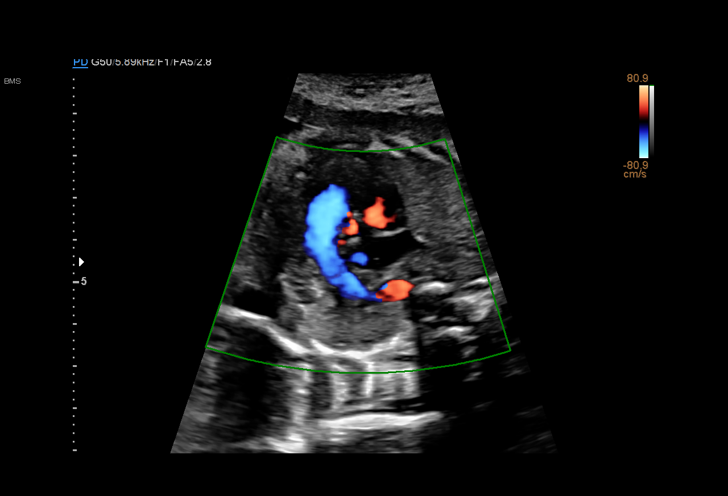
[im 37/109]
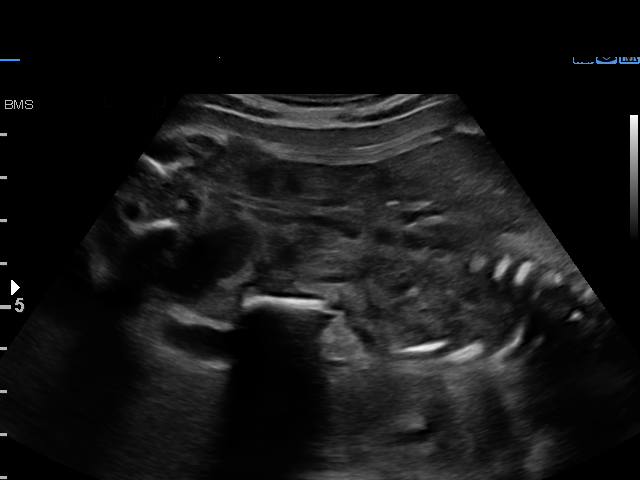
[im 45/109]
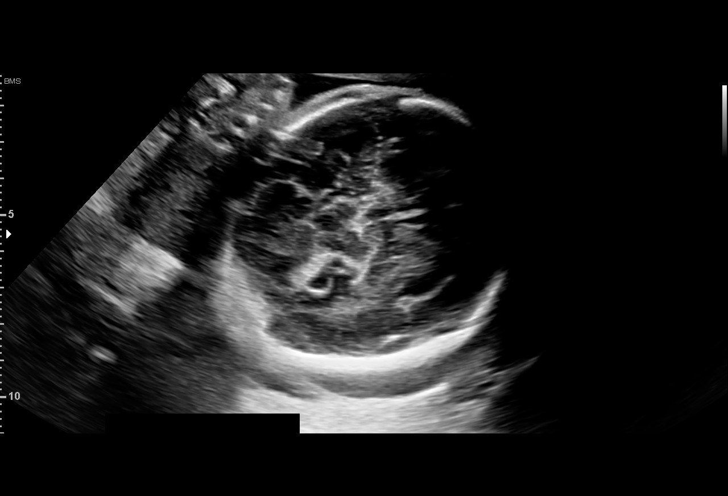
[im 57/109]
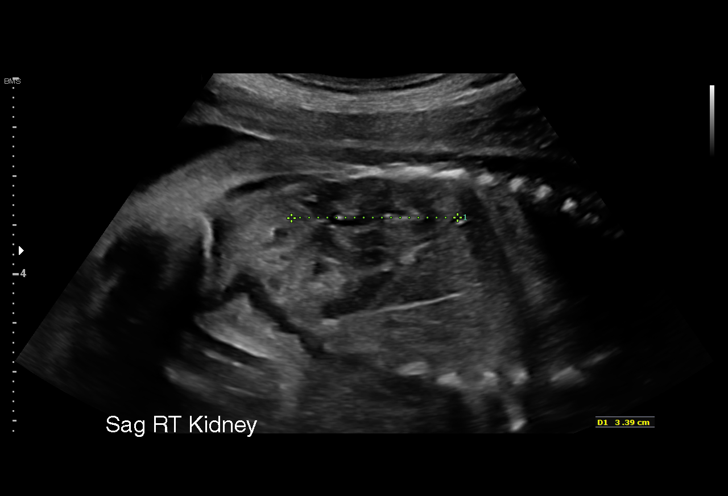
[im 65/109]
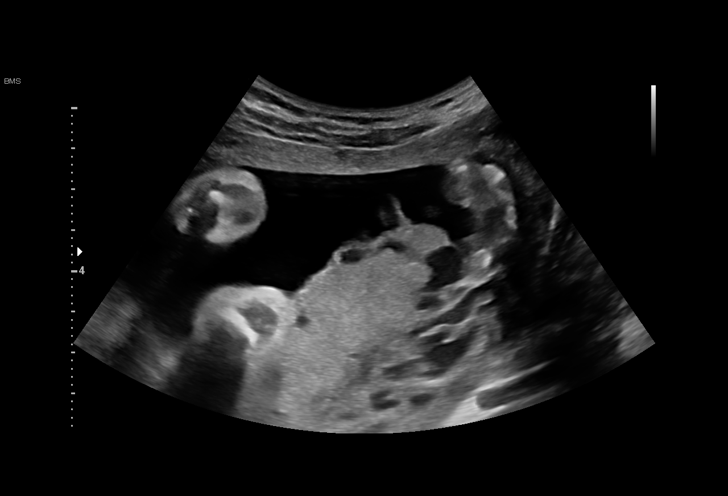
[im 73/109]
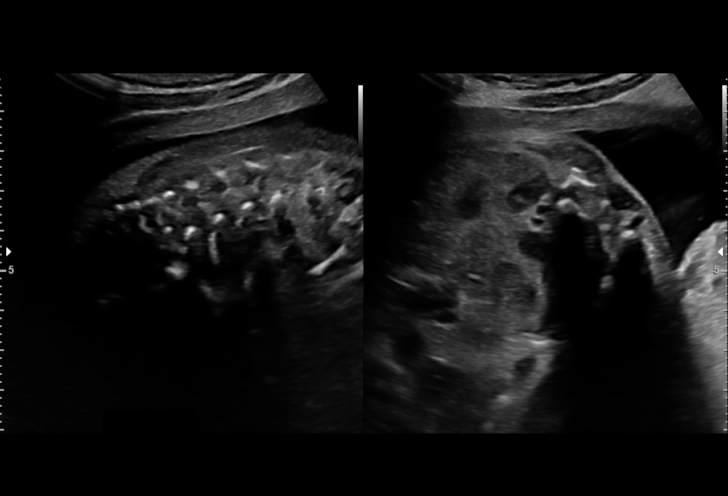
[im 81/109]
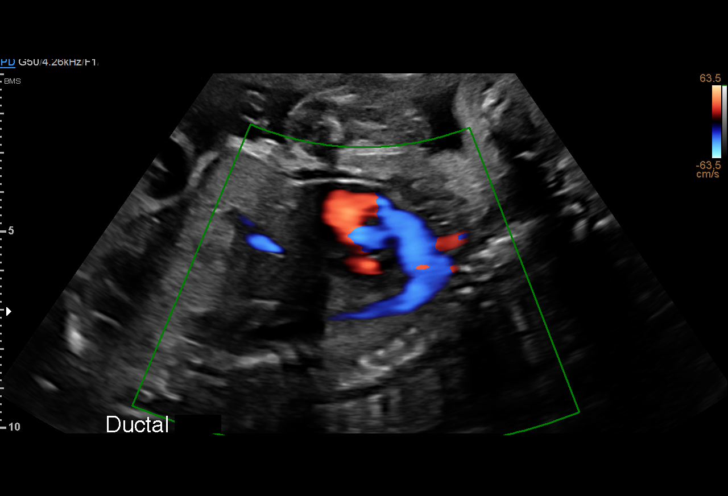
[im 89/109]
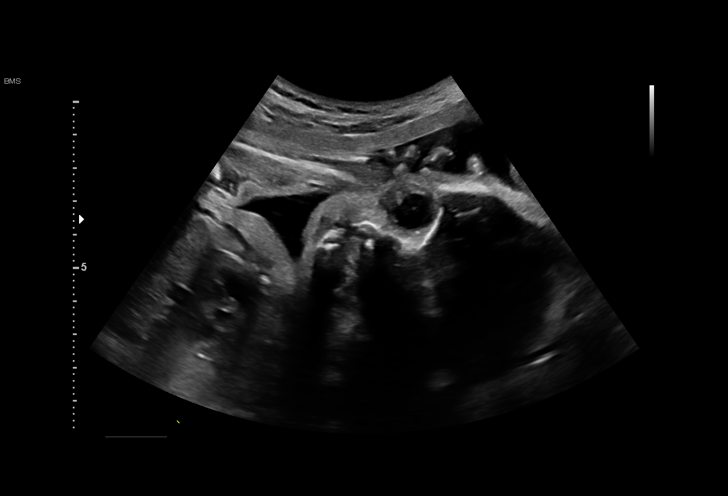
[im 97/109]
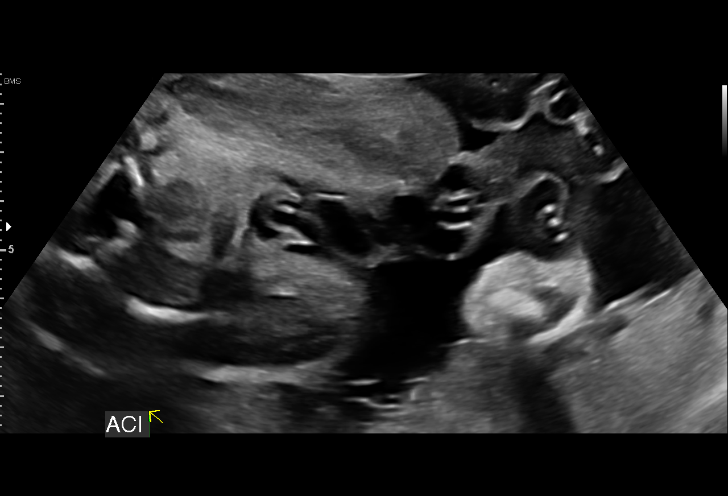
[im 105/109]
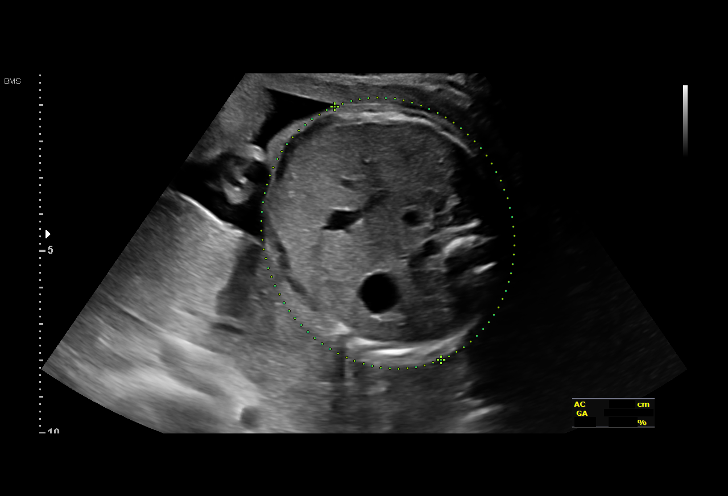

[13 of 28 positions shown; findings below may reference images not displayed]

1  US MFM OB COMP + 14 WK                76805.01    TRIAS CUNENGSIH

Indications

 Encounter for antenatal screening for
 malformations
 Insufficient Prenatal Care
 History of cesarean delivery, currently
 pregnant (x3)
 Medical complication of pregnancy
 (Prediabetic 5.7)
 Drug use complicating pregnancy, second
 trimester (THC)
 28 weeks gestation of pregnancy
Fetal Evaluation

 Num Of Fetuses:         1
 Fetal Heart Rate(bpm):  136
 Cardiac Activity:       Observed
 Presentation:           Cephalic
 Placenta:               Posterior
 P. Cord Insertion:      Visualized, central

 Amniotic Fluid
 AFI FV:      Within normal limits

                             Largest Pocket(cm)

Biometry
 BPD:      72.2  mm     G. Age:  29w 0d         66  %    CI:        73.92   %    70 - 86
                                                         FL/HC:      19.7   %    18.8 -
 HC:      266.7  mm     G. Age:  29w 0d         47  %    HC/AC:      1.20        1.05 -
 AC:      222.2  mm     G. Age:  26w 5d          8  %    FL/BPD:     72.7   %    71 - 87
 FL:       52.5  mm     G. Age:  28w 0d         29  %    FL/AC:      23.6   %    20 - 24
 HUM:      50.6  mm     G. Age:  29w 5d         76  %
 CER:        37  mm     G. Age:  30w 4d         97  %

 LV:        3.7  mm
 CM:        3.4  mm

 Est. FW:    2262  gm      2 lb 6 oz     16  %
OB History

 Gravidity:    6
 Living:       3
Gestational Age

 LMP:           27w 5d        Date:  05/12/21                 EDD:   02/16/22
 U/S Today:     28w 1d                                        EDD:   02/13/22
 Best:          28w 1d     Det. By:  U/S (11/22/21)           EDD:   02/13/22
Anatomy

 Cranium:               Appears normal         Aortic Arch:            Appears normal
 Cavum:                 Appears normal         Ductal Arch:            Appears normal
 Ventricles:            Appears normal         Diaphragm:              Appears normal
 Choroid Plexus:        Appears normal         Stomach:                Appears normal, left
                                                                       sided
 Cerebellum:            Appears normal         Abdomen:                Appears normal
 Posterior Fossa:       Appears normal         Abdominal Wall:         Appears nml (cord
                                                                       insert, abd wall)
 Nuchal Fold:           Not applicable (>20    Cord Vessels:           Appears normal (3
                        wks GA)                                        vessel cord)
 Face:                  Appears normal         Kidneys:                Appear normal
                        (orbits and profile)
 Lips:                  Appears normal         Bladder:                Appears normal
 Thoracic:              Appears normal         Spine:                  Appears normal
 Heart:                 Appears normal         Upper Extremities:      Appears normal
                        (4CH, axis, and
                        situs)
 RVOT:                  Appears normal         Lower Extremities:      Appears normal
 LVOT:                  Appears normal

 Other:  Heels/feet and open hands/5th digits visualized. Gender suboptimally
         seen but suspect male.
Cervix Uterus Adnexa

 Cervix
 Not visualized (advanced GA >48wks)

 Uterus
 No abnormality visualized.
 Right Ovary
 Within normal limits.

 Left Ovary
 Within normal limits.

 Cul De Sac
 No free fluid seen.

 Adnexa
 No adnexal mass visualized.
Impression

 Ms. Sperling is a 30 yo G63 who is here with uncertain dates
 but EDD 02/16/22. She is seen today at the request Ramire
 Obi, CNM

 Single intrauterine pregnancy here for a follow up growth due
 to late prenatal care and uncertain LMP and no prior US
 exams.

 Given that Ms. Sperling has an uncertain LMP we have dated
 the pregnancy by today's ultrasound with a EDD of 02/13/21

 Normal anatomy with measurements less than dates due to
 FGR EFW 16th% with an AC at the 8th%.
 There is good fetal movement and amniotic fluid volume
 The UA Dopplers are normal without evidence of AEDF or
 REDF.

 I discussed today's visit with a diagnosis of FGR. I explained
 that the etiology includes placental insufficiency, chronic
 disease, infection, aneuploidy and other genetic syndromes.
 She has a  NIPS pending.

 She has no additional risk factors for chronic disease. Ms.
 Sperling has a small frame and I explained that I suspect that
 this is constitutional.

 At this time I explained the diagnosis, evaluation and
 management to include on serial fetal growth and antenatal
 testing to include UA Dopplers.

 Delivery is recommeded at 39 weeks.

 All questions answered

 I spent 20 minutes with > 50% in face to face consultation.

## 2023-06-08 IMAGING — US US MFM UA CORD DOPPLER
1 series · 13 of 28 positions shown · non-contrast
Comparison: none

[Series 1: us mfm ua cord doppler · 48 acquisitions, 13 frames shown]
[im 2/48]
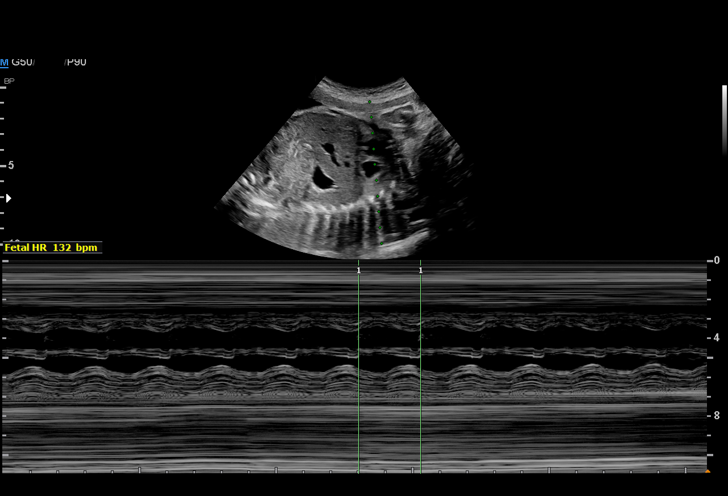
[im 6/48]
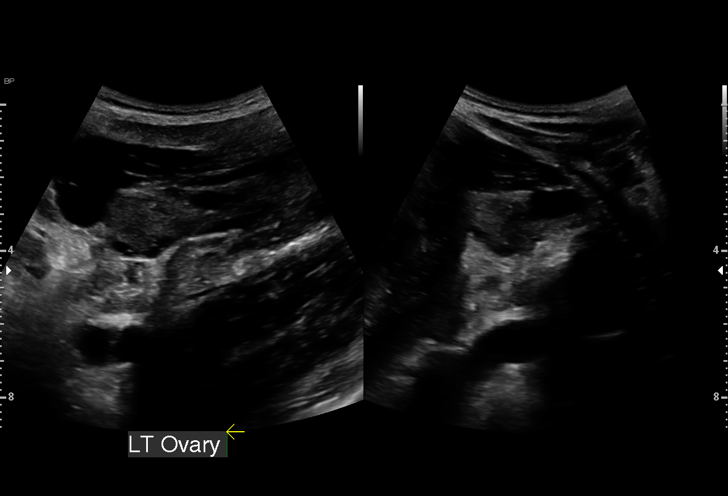
[im 9/48]
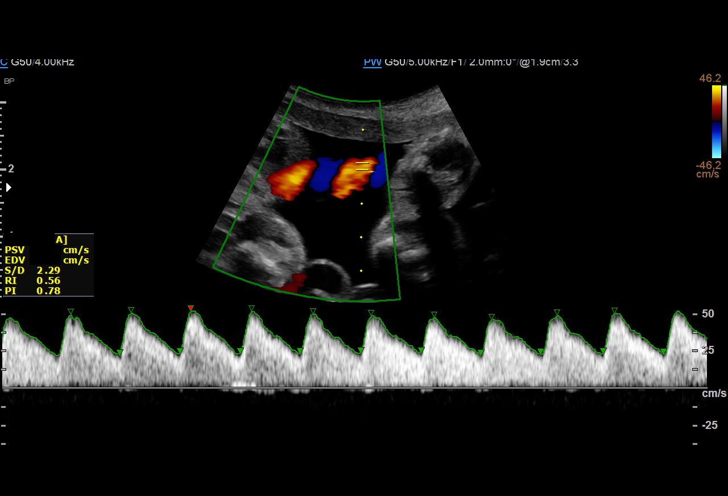
[im 13/48]
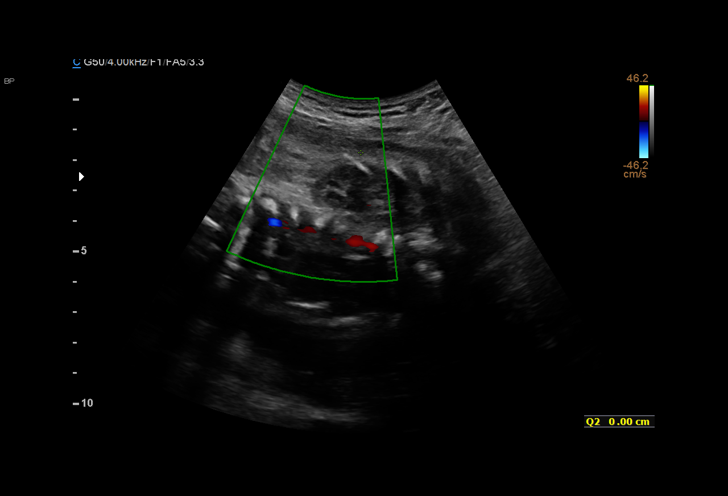
[im 16/48]
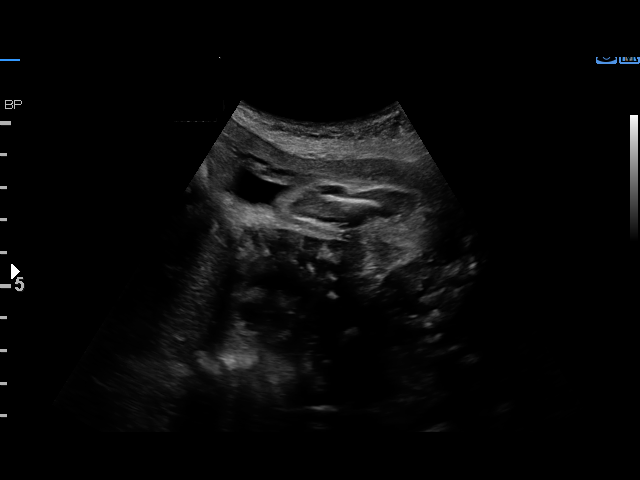
[im 20/48]
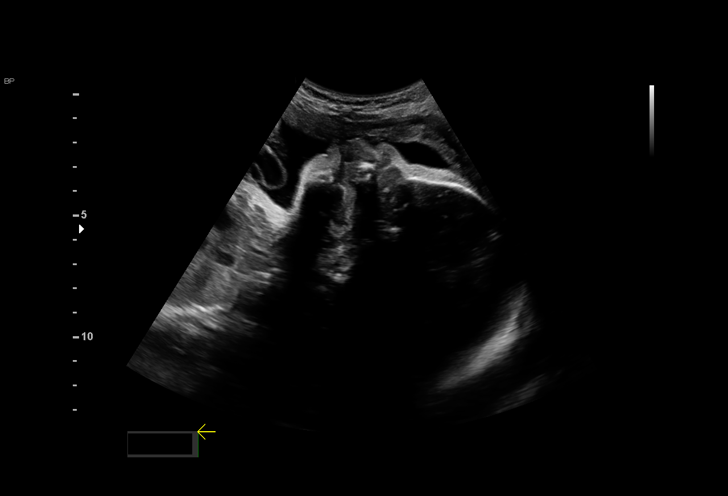
[im 25/48]
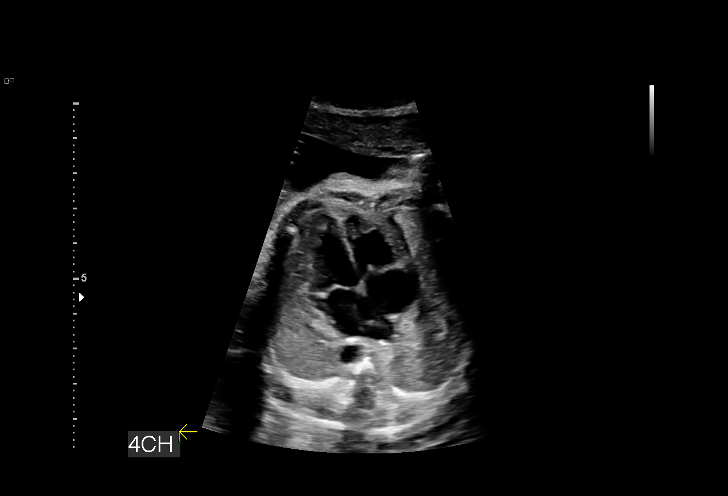
[im 28/48]
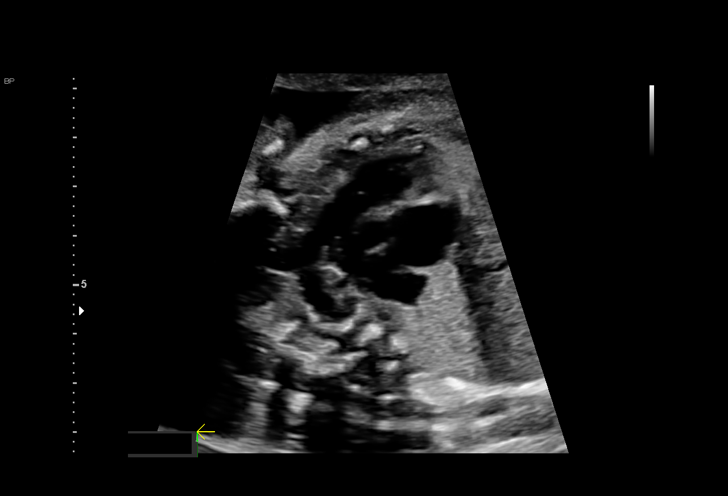
[im 32/48]
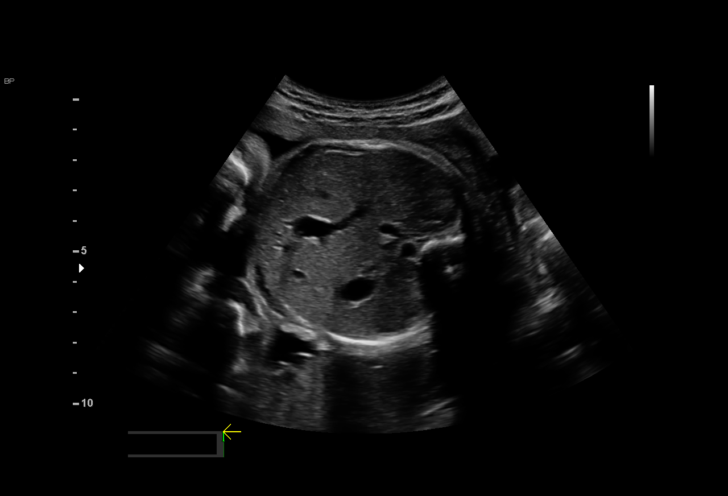
[im 35/48]
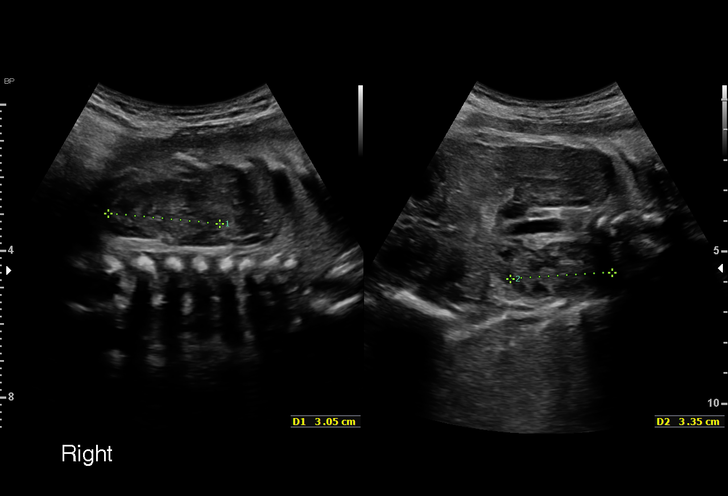
[im 39/48]
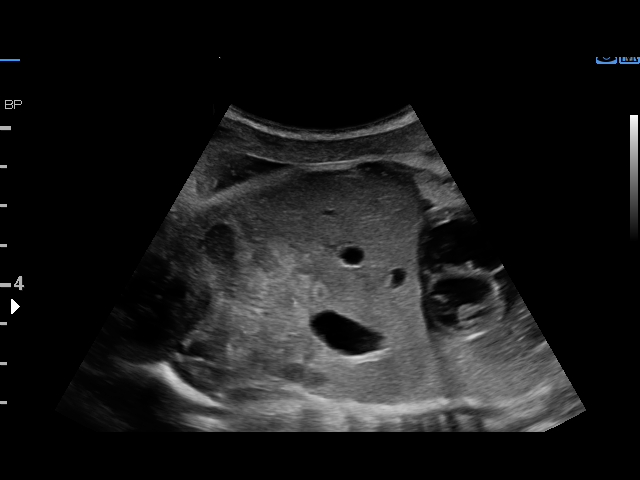
[im 42/48]
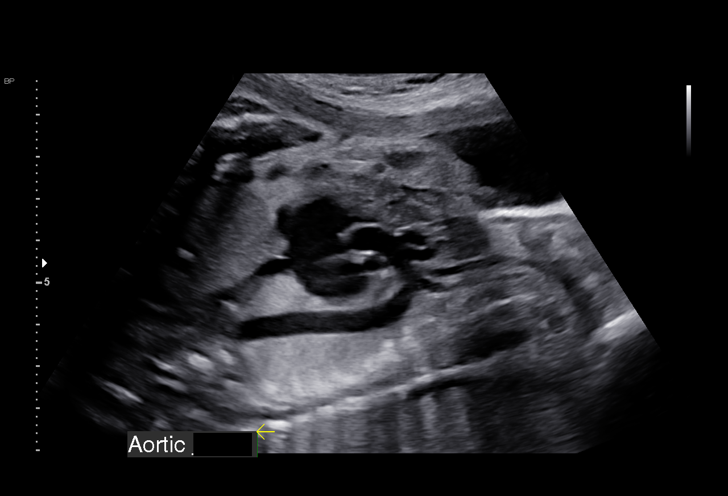
[im 46/48]
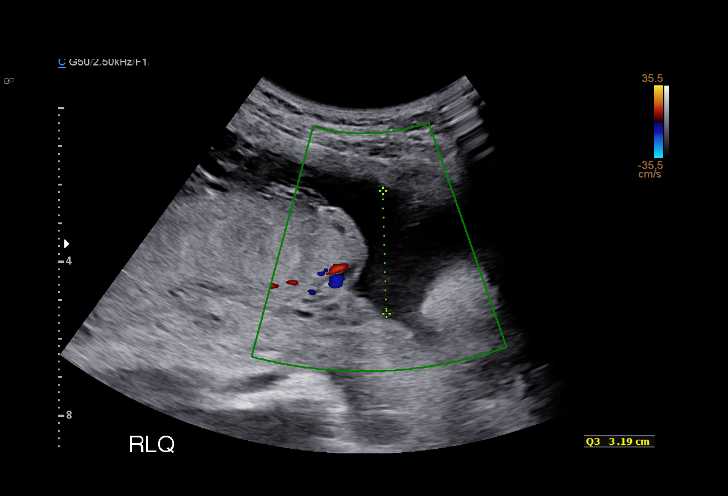

[13 of 28 positions shown; findings below may reference images not displayed]

1  US MFM UA CORD DOPPLER                76820.02    BHEB[facility]
                                                      TRISHORNA
 2  US MFM OB LIMITED                     76815.01    [facility]
                                                      TRISHORNA

Indications

 Maternal care for known or suspected poor
 fetal growth, third trimester, not applicable or
 unspecified IUGR
 Insufficient Prenatal Care
 Genetic carrier (silent carrier for alpha
 thalassemia)
 History of cesarean delivery, currently
 pregnant (x3)
 Medical complication of pregnancy
 (Prediabetic 5.7)
 Drug use complicating pregnancy, third
 trimester (THC)
 30 weeks gestation of pregnancy
Fetal Evaluation

 Num Of Fetuses:          1
 Fetal Heart Rate(bpm):   132
 Cardiac Activity:        Observed
 Presentation:            Cephalic
 Placenta:                Posterior
 P. Cord Insertion:       Previously Visualized
 Amniotic Fluid
 AFI FV:      Subjectively low-normal

 AFI Sum(cm)     %Tile       Largest Pocket(cm)
 7.01            < 3

 RUQ(cm)       RLQ(cm)       LUQ(cm)        LLQ(cm)
 2.95          2.14          0
OB History

 Gravidity:    6
 Living:       3
Gestational Age

 LMP:           29w 5d        Date:  05/12/21                 EDD:   02/16/22
 Best:          30w 1d     Det. By:  U/S  (11/22/21)          EDD:   02/13/22
Anatomy

 Cranium:               Previously seen        Aortic Arch:            Appears normal
 Cavum:                 Previously seen        Ductal Arch:            Previously seen
 Ventricles:            Previously seen        Diaphragm:              Appears normal
 Choroid Plexus:        Previously seen        Stomach:                Appears normal, left
                                                                       sided
 Cerebellum:            Previously seen        Abdomen:                Previously seen
 Posterior Fossa:       Previously seen        Abdominal Wall:         Previously seen
 Nuchal Fold:           Not applicable (>20    Cord Vessels:           Appears normal (3
                        wks GA)                                        vessel cord)
 Face:                  Orbits and profile     Kidneys:                Appear normal
                        previously seen
 Lips:                  Previously seen        Bladder:                Appears normal
 Thoracic:              Appears normal         Spine:                  Previously seen
 Heart:                 Appears normal         Upper Extremities:      Previously seen
                        (4CH, axis, and
                        situs)
 RVOT:                  Appears normal         Lower Extremities:      Previously seen
 LVOT:                  Appears normal

 Other:  Fetus appears to be a male. Heels/feet and open hands/5th digits
         previously visualized. Technically difficult due to advanced GA and
         fetal position.
Doppler - Fetal Vessels

 Umbilical Artery
   S/D    %tile      RI    %tile      PI    %tile            ADFV    RDFV
  2.36       22    0.58       22    0.83       24               No      No

Cervix Uterus Adnexa

 Cervix
 Not visualized (advanced GA >05wks)
 Uterus
 Normal shape and size.

 Right Ovary
 Not visualized.

 Left Ovary
 Not visualized.

 Cul De Sac
 No free fluid seen.

 Adnexa
 No adnexal mass visualized.
Impression

 Antenatal testing due to IUGR with an EFW 16th% with an
 AC < 8th%.
 Biophysical profile [DATE] with good fetal movement and
 amniotic fluid volume (Subjectively low).
 UA Dopplers are normal with no evidence of AEDF or REDF

 Ms. Keva reports good fetal movement. She was
 encouraged to PO hydrate.
Recommendations

 Continue weekly testing with UA dopplers
 Repeat growth as previously scheduled.

## 2023-07-20 IMAGING — US US MFM FETAL BPP W/O NON-STRESS
1 series · 14 of 14 positions shown · non-contrast
Comparison: none

[Series 1: us mfm fetal bpp w/o non-stress · 14 acquisitions, 14 frames shown]
[im 1/14]
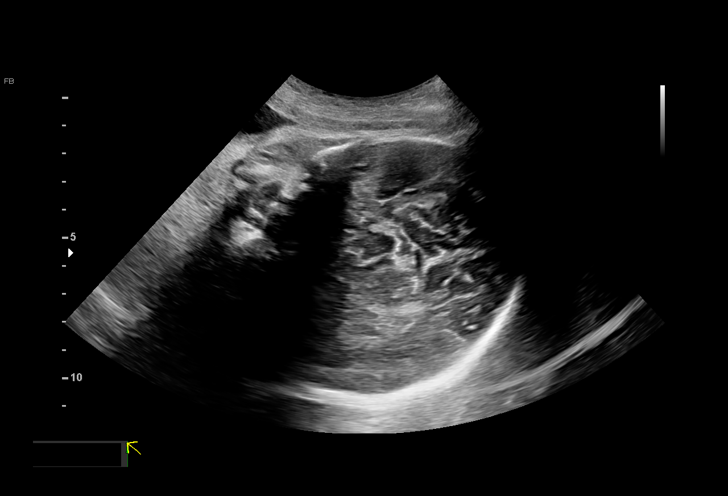
[im 2/14]
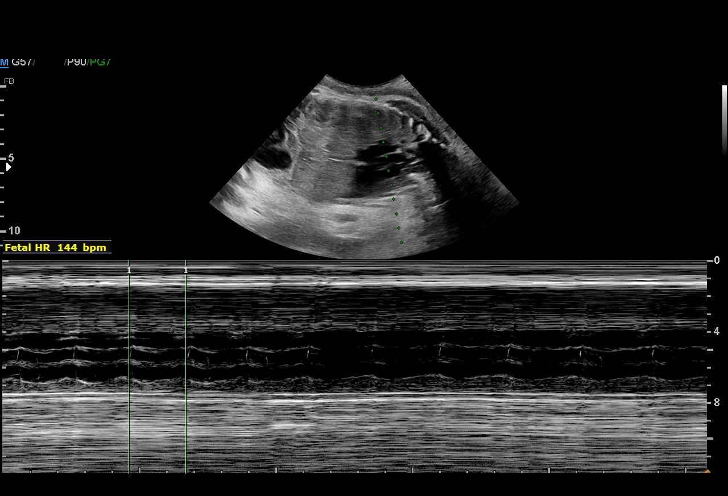
[im 3/14]
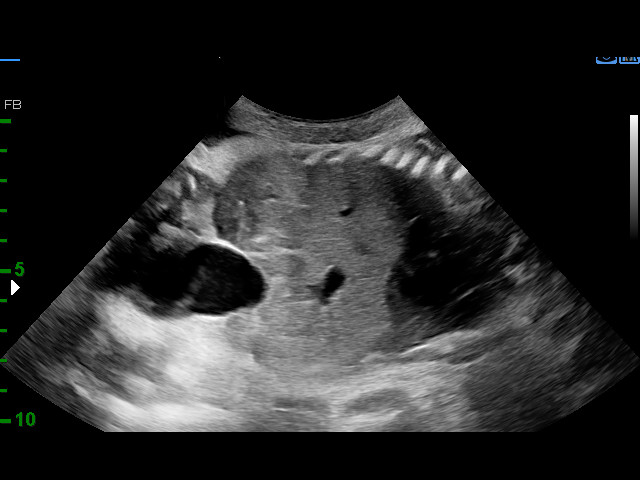
[im 4/14]
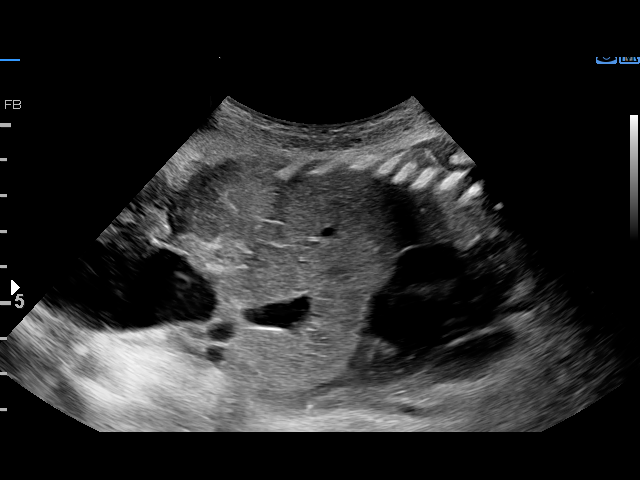
[im 5/14]
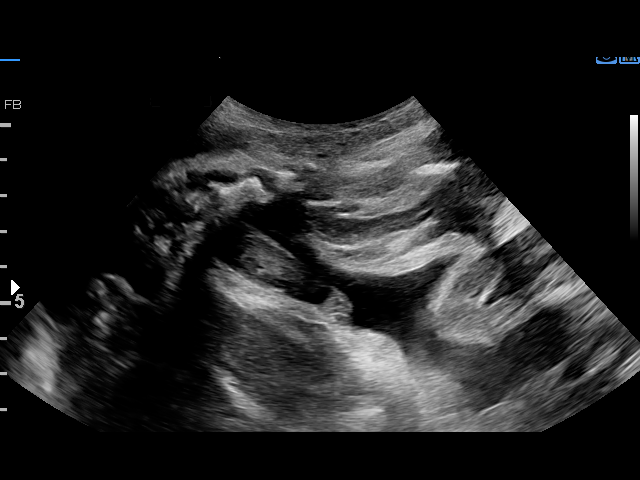
[im 6/14]
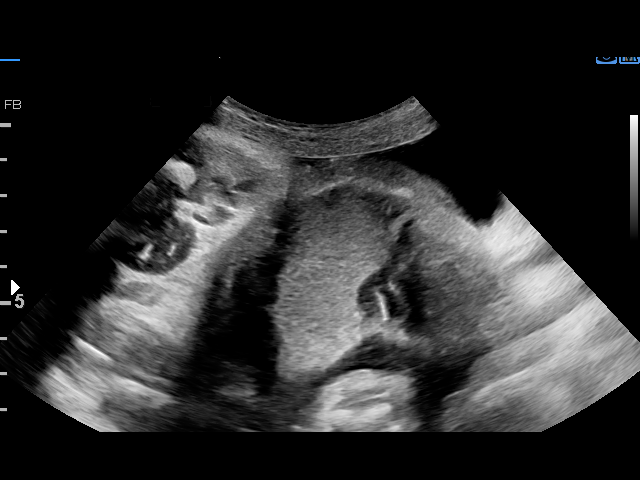
[im 7/14]
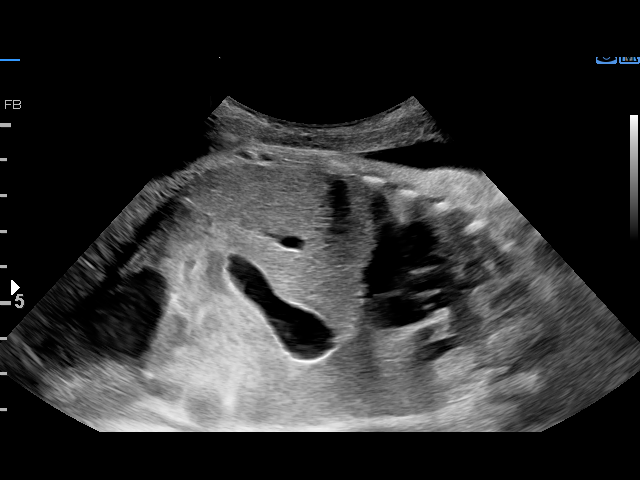
[im 8/14]
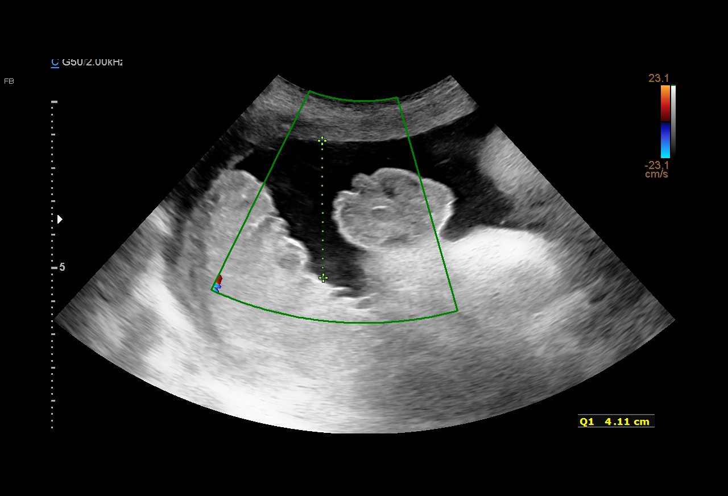
[im 9/14]
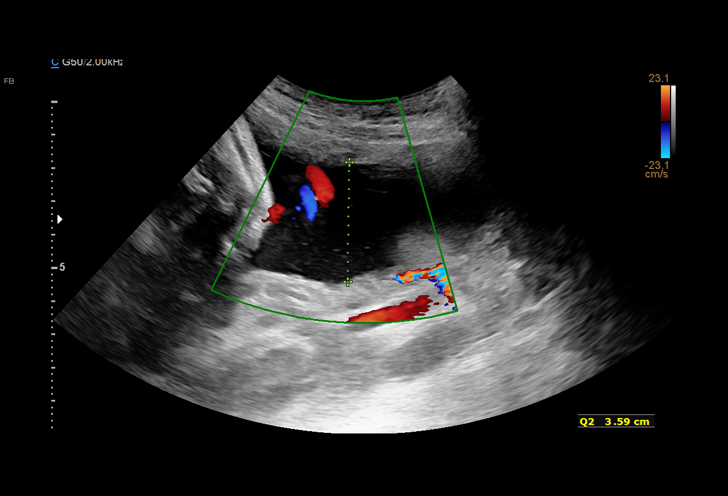
[im 10/14]
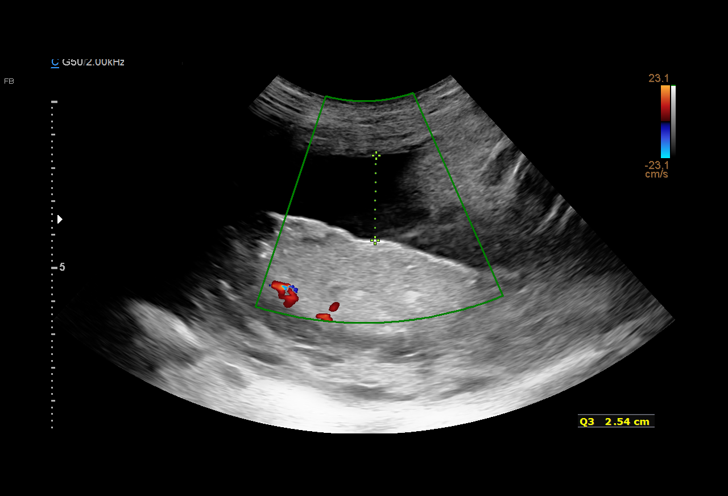
[im 11/14]
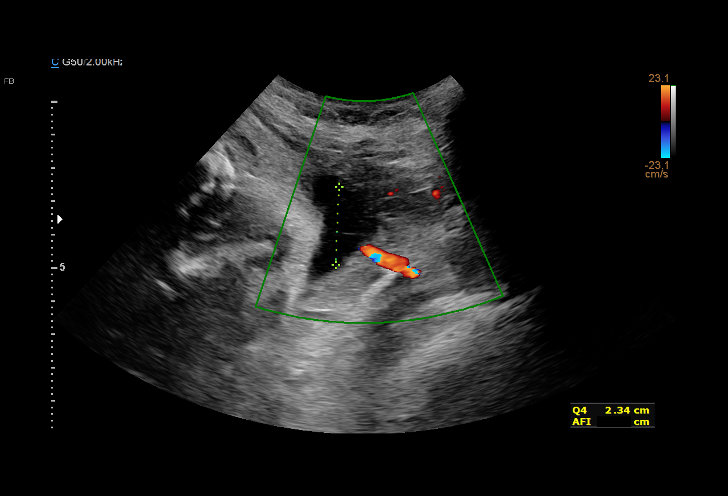
[im 12/14]
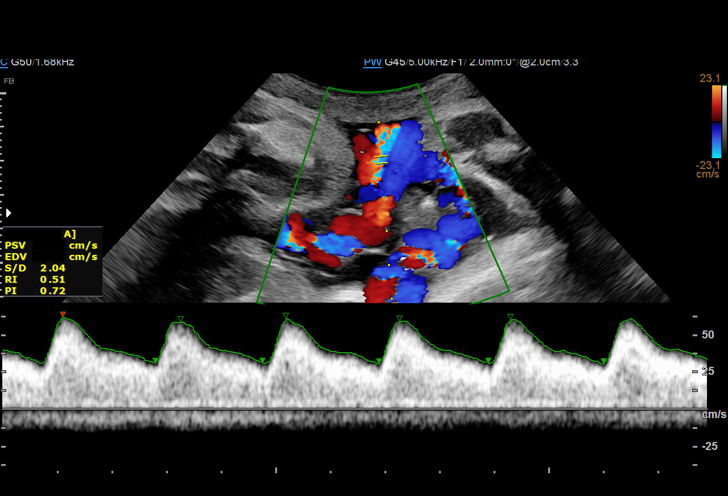
[im 13/14]
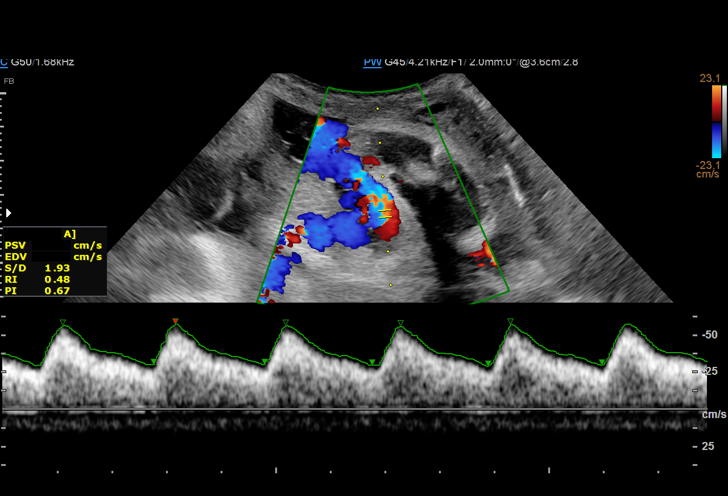
[im 14/14]
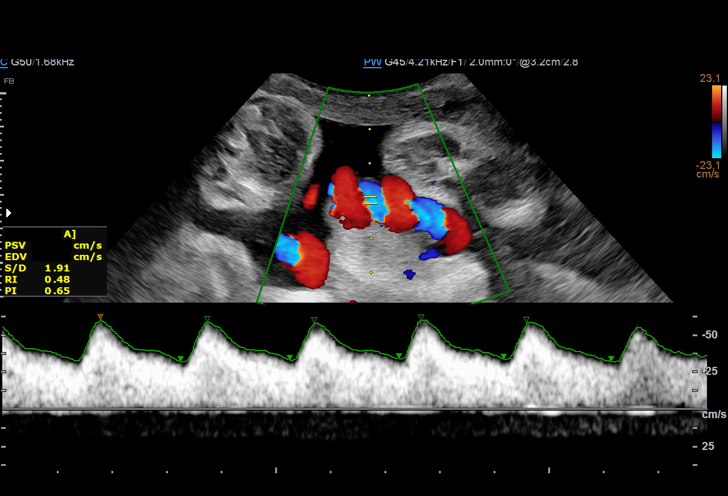

[14 of 14 positions shown; findings below may reference images not displayed]

Indications

 Maternal care for known or suspected poor
 fetal growth, third trimester, fetus 1 IUGR
 Drug use complicating pregnancy, third
 trimester (THC)
 Insufficient Prenatal Care
 Genetic carrier (silent carrier for alpha
 thalassemia)
 History of cesarean delivery, currently
 pregnant (x3)
 Medical complication of pregnancy
 (Prediabetic 5.7)
 36 weeks gestation of pregnancy
Fetal Evaluation

 Num Of Fetuses:         1
 Fetal Heart Rate(bpm):  144
 Cardiac Activity:       Observed
 Presentation:           Cephalic
 Placenta:               Posterior
 P. Cord Insertion:      Previously Visualized
 Amniotic Fluid
 AFI FV:      Within normal limits

 AFI Sum(cm)     %Tile       Largest Pocket(cm)
 12.58           41

 RUQ(cm)       RLQ(cm)       LUQ(cm)        LLQ(cm)

Biophysical Evaluation

 Amniotic F.V:   Pocket => 2 cm             F. Tone:        Observed
 F. Movement:    Observed                   Score:          [DATE]
 F. Breathing:   Observed
OB History

 Gravidity:    6
 Living:       3
Gestational Age

 LMP:           35w 5d        Date:  05/12/21                 EDD:   02/16/22
 Best:          36w 1d     Det. By:  U/S  (11/22/21)          EDD:   02/13/22
Doppler - Fetal Vessels

 Umbilical Artery
  S/D     %tile      RI    %tile      PI    %tile     PSV    ADFV    RDFV
                                                    (cm/s)
  1.96       20    0.49       16    0.[REDACTED]      No      No

Comments

 This patient was seen due to an IUGR fetus, based on the
 small AC measurements.  She denies any problems since
 her last exam.  She reports feeling vigorous fetal movements
 throughout the day.
 A biophysical profile performed today was [DATE].
 There was normal amniotic fluid noted on today's ultrasound
 exam.
 Doppler studies of the umbilical arteries performed due to
 fetal growth restriction showed a normal S/D ratio of 1.96.
 There were no signs of absent or reversed end-diastolic flow
 noted today.
 She will return in 1 week for another BPP and umbilical artery
 Doppler study.

 Due to IUGR, delivery is recommended at between 38 to 39
 weeks.
